# Patient Record
Sex: Male | Born: 1960 | Race: White | Hispanic: No | Marital: Married | Smoking: Current every day smoker
Health system: Southern US, Community
[De-identification: ages and names within clinical notes are randomized; demographics above are authoritative.]

## PROBLEM LIST (undated history)

## (undated) DIAGNOSIS — C679 Malignant neoplasm of bladder, unspecified: Secondary | ICD-10-CM

## (undated) DIAGNOSIS — B029 Zoster without complications: Secondary | ICD-10-CM

---

## 2014-10-06 ENCOUNTER — Emergency Department: Payer: Self-pay | Admitting: Emergency Medicine

## 2014-10-06 LAB — CBC WITH DIFFERENTIAL/PLATELET
BASOS PCT: 1.4 %
Basophil #: 0.1 10*3/uL (ref 0.0–0.1)
Eosinophil #: 0 10*3/uL (ref 0.0–0.7)
Eosinophil %: 0.3 %
HCT: 47.5 % (ref 40.0–52.0)
HGB: 15.4 g/dL (ref 13.0–18.0)
LYMPHS ABS: 1.4 10*3/uL (ref 1.0–3.6)
Lymphocyte %: 15.2 %
MCH: 31.7 pg (ref 26.0–34.0)
MCHC: 32.5 g/dL (ref 32.0–36.0)
MCV: 98 fL (ref 80–100)
Monocyte #: 0.8 x10 3/mm (ref 0.2–1.0)
Monocyte %: 9 %
NEUTROS PCT: 74.1 %
Neutrophil #: 6.8 10*3/uL — ABNORMAL HIGH (ref 1.4–6.5)
PLATELETS: 197 10*3/uL (ref 150–440)
RBC: 4.87 10*6/uL (ref 4.40–5.90)
RDW: 13.6 % (ref 11.5–14.5)
WBC: 9.2 10*3/uL (ref 3.8–10.6)

## 2014-10-06 LAB — BASIC METABOLIC PANEL
Anion Gap: 5 — ABNORMAL LOW (ref 7–16)
BUN: 15 mg/dL (ref 7–18)
CALCIUM: 9 mg/dL (ref 8.5–10.1)
CHLORIDE: 106 mmol/L (ref 98–107)
Co2: 25 mmol/L (ref 21–32)
Creatinine: 0.82 mg/dL (ref 0.60–1.30)
EGFR (African American): >60
EGFR (Non-African Amer.): >60
GLUCOSE: 92 mg/dL (ref 65–99)
Osmolality: 272 (ref 275–301)
Potassium: 4.3 mmol/L (ref 3.5–5.1)
Sodium: 136 mmol/L (ref 136–145)

## 2016-11-08 ENCOUNTER — Emergency Department
Admission: EM | Admit: 2016-11-08 | Discharge: 2016-11-08 | Disposition: A | Discharge status: Home / Self Care | Payer: Self-pay | Attending: Emergency Medicine | Admitting: Emergency Medicine

## 2016-11-08 ENCOUNTER — Encounter: Payer: Self-pay | Admitting: Emergency Medicine

## 2016-11-08 DIAGNOSIS — F17210 Nicotine dependence, cigarettes, uncomplicated: Secondary | ICD-10-CM | POA: Insufficient documentation

## 2016-11-08 DIAGNOSIS — Z8551 Personal history of malignant neoplasm of bladder: Secondary | ICD-10-CM | POA: Insufficient documentation

## 2016-11-08 DIAGNOSIS — K529 Noninfective gastroenteritis and colitis, unspecified: Secondary | ICD-10-CM | POA: Insufficient documentation

## 2016-11-08 HISTORY — DX: Malignant neoplasm of bladder, unspecified: C67.9

## 2016-11-08 LAB — URINALYSIS COMPLETE WITH MICROSCOPIC (ARMC ONLY)
BILIRUBIN URINE: NEGATIVE
Bacteria, UA: NONE SEEN
Glucose, UA: NEGATIVE mg/dL
Hgb urine dipstick: NEGATIVE
KETONES UR: NEGATIVE mg/dL
LEUKOCYTES UA: NEGATIVE
Nitrite: NEGATIVE
Protein, ur: NEGATIVE mg/dL
RBC / HPF: NONE SEEN RBC/hpf (ref 0–5)
Specific Gravity, Urine: 1.003 — ABNORMAL LOW (ref 1.005–1.030)
pH: 8 (ref 5.0–8.0)

## 2016-11-08 LAB — CBC WITH DIFFERENTIAL/PLATELET
BASOS ABS: 0 10*3/uL (ref 0–0.1)
Basophils Relative: 1 %
Eosinophils Absolute: 0 10*3/uL (ref 0–0.7)
Eosinophils Relative: 1 %
HEMATOCRIT: 47.9 % (ref 40.0–52.0)
Hemoglobin: 16.4 g/dL (ref 13.0–18.0)
LYMPHS ABS: 1.5 10*3/uL (ref 1.0–3.6)
LYMPHS PCT: 26 %
MCH: 32.9 pg (ref 26.0–34.0)
MCHC: 34.3 g/dL (ref 32.0–36.0)
MCV: 96 fL (ref 80.0–100.0)
MONO ABS: 0.6 10*3/uL (ref 0.2–1.0)
MONOS PCT: 11 %
NEUTROS ABS: 3.6 10*3/uL (ref 1.4–6.5)
Neutrophils Relative %: 61 %
Platelets: 199 10*3/uL (ref 150–440)
RBC: 4.99 MIL/uL (ref 4.40–5.90)
RDW: 13.4 % (ref 11.5–14.5)
WBC: 5.7 10*3/uL (ref 3.8–10.6)

## 2016-11-08 LAB — COMPREHENSIVE METABOLIC PANEL
ALT: 14 U/L — ABNORMAL LOW (ref 17–63)
AST: 22 U/L (ref 15–41)
Albumin: 4.3 g/dL (ref 3.5–5.0)
Alkaline Phosphatase: 49 U/L (ref 38–126)
Anion gap: 7 (ref 5–15)
BILIRUBIN TOTAL: 0.9 mg/dL (ref 0.3–1.2)
BUN: 17 mg/dL (ref 6–20)
CO2: 25 mmol/L (ref 22–32)
CREATININE: 0.85 mg/dL (ref 0.61–1.24)
Calcium: 9.5 mg/dL (ref 8.9–10.3)
Chloride: 106 mmol/L (ref 101–111)
GFR calc Af Amer: >60 mL/min (ref >60)
Glucose, Bld: 119 mg/dL — ABNORMAL HIGH (ref 65–99)
POTASSIUM: 4 mmol/L (ref 3.5–5.1)
Sodium: 138 mmol/L (ref 135–145)
TOTAL PROTEIN: 7.7 g/dL (ref 6.5–8.1)

## 2016-11-08 LAB — LIPASE, BLOOD: LIPASE: 24 U/L (ref 11–51)

## 2016-11-08 MED ORDER — SODIUM CHLORIDE 0.9 % IV SOLN
1000.0000 mL | Freq: Once | INTRAVENOUS | Status: AC
  Administered 2016-11-08: 1000 mL via INTRAVENOUS

## 2016-11-08 MED ORDER — ONDANSETRON 4 MG PO TBDP: 4.0000 mg | ORAL_TABLET | Freq: Three times a day (TID) | ORAL | 0 refills | Status: DC | PRN

## 2016-11-08 MED ORDER — ONDANSETRON HCL 4 MG/2ML IJ SOLN
4.0000 mg | Freq: Once | INTRAMUSCULAR | Status: AC
  Administered 2016-11-08: 4 mg via INTRAVENOUS
  Filled 2016-11-08: qty 2

## 2016-11-08 MED ORDER — MORPHINE SULFATE (PF) 4 MG/ML IV SOLN
4.0000 mg | Freq: Once | INTRAVENOUS | Status: DC
  Filled 2016-11-08: qty 1

## 2016-11-08 NOTE — ED Notes (Signed)
Pt given crackers and ginger ale for PO trial; pt reports no difficulty or nausea since consuming.

## 2016-11-08 NOTE — ED Provider Notes (Signed)
[facility] Emergency Department Provider Note        Time seen: ----------------------------------------- 7:01 AM on 11/08/2016 -----------------------------------------    I have reviewed the triage vital signs and the nursing notes.   HISTORY  Chief Complaint Emesis and Diarrhea    HPI [facility] is a 55 y.o. male who presents to the ER for nausea, vomiting and diarrhea.Patient reports and Sundays having headache, chills, body aches with vomiting and diarrhea. Nothing makes his symptoms better. Family is concerned that may have had food poisoning. He complains of generalized abdominal cramping, states he has been to keep some liquids down but no solid food.   Past Medical History:  Diagnosis Date  . Bladder cancer ([place])     There are no active problems to display for this patient.   History reviewed. No pertinent surgical history.  Allergies Patient has no known allergies.  Social History Social History  Substance Use Topics  . Smoking status: Current Every Day Smoker    Packs/day: 1.00    Types: Cigarettes  . Smokeless tobacco: Never Used  . Alcohol use Yes     Comment: daily    Review of Systems Constitutional: Negative for fever.Positive for chills Cardiovascular: Negative for chest pain. Respiratory: Negative for shortness of breath. Gastrointestinal: Positive for abdominal pain, vomiting and diarrhea Genitourinary: Negative for dysuria. Musculoskeletal: Negative for back pain. Skin: Negative for rash. Neurological: Positive for headache  10-point ROS otherwise negative.  ____________________________________________   PHYSICAL EXAM:  VITAL SIGNS: ED Triage Vitals  Enc Vitals Group     BP 11/08/16 0640 (!) 149/91     Pulse Rate 11/08/16 0640 80     Resp 11/08/16 0640 18     Temp 11/08/16 0640 98.1 F (36.7 C)     Temp Source 11/08/16 0640 Oral     SpO2 11/08/16 0640 99 %     Weight 11/08/16 0638 150 lb (68 kg)      Height 11/08/16 0638 5\' 10"  (1.778 m)     Head Circumference --      Peak Flow --      Pain Score 11/08/16 0638 4     Pain Loc --      Pain Edu? --      Excl. in [place]? --     Constitutional: Alert and oriented. Well appearing and in no distress. Eyes: Conjunctivae are normal. PERRL. Normal extraocular movements. ENT   Head: Normocephalic and atraumatic.   Nose: No congestion/rhinnorhea.   Mouth/Throat: Mucous membranes are moist.   Neck: No stridor. Cardiovascular: Normal rate, regular rhythm. No murmurs, rubs, or gallops. Respiratory: Normal respiratory effort without tachypnea nor retractions. Breath sounds are clear and equal bilaterally. No wheezes/rales/rhonchi. Gastrointestinal: Soft and nontender. Normal bowel sounds Musculoskeletal: Nontender with normal range of motion in all extremities. No lower extremity tenderness nor edema. Neurologic:  Normal speech and language. No gross focal neurologic deficits are appreciated.  Skin:  Skin is warm, dry and intact. No rash noted. Psychiatric: Mood and affect are normal. Speech and behavior are normal.  ____________________________________________  ED COURSE:  Pertinent labs & imaging results that were available during my care of the patient were reviewed by me and considered in my medical decision making (see chart for details). Clinical Course   Patient is in no distress, we will assess with basic labs and give IV fluid with antiemetics  Procedures ____________________________________________   LABS (pertinent positives/negatives)  Labs Reviewed  COMPREHENSIVE METABOLIC PANEL - Abnormal; Notable  for the following:       Result Value   Glucose, Bld 119 (*)    ALT 14 (*)    All other components within normal limits  URINALYSIS COMPLETEWITH MICROSCOPIC ([facility] ONLY) - Abnormal; Notable for the following:    Color, Urine STRAW (*)    APPearance CLEAR (*)    Specific Gravity, Urine 1.003 (*)    Squamous  Epithelial / LPF 0-5 (*)    All other components within normal limits  CBC WITH DIFFERENTIAL/PLATELET  LIPASE, BLOOD   ____________________________________________  FINAL ASSESSMENT AND PLAN  Gastroenteritis  Plan: Patient with labs as dictated above. Patient looks well, is in no acute distress and has tolerated food by mouth. He'll be discharged antiemetics and encouraged close follow-up with his doctor.   Earleen Newport, MD   Note: This dictation was prepared with Dragon dictation. Any transcriptional errors that result from this process are unintentional    Earleen Newport, MD 11/08/16 [phone]

## 2016-11-08 NOTE — ED Notes (Signed)
Pt discharged home after verbalizing understanding of discharge instructions; nad noted. 

## 2016-11-08 NOTE — ED Notes (Signed)
Pt presents with n/v/d x 3 days. Wife states he has been "drinking like a fish" for a while due to depression. States he is unable to work due to "sickness" and is going to file bankruptcy, which has led to depression.

## 2016-11-08 NOTE — ED Triage Notes (Signed)
Patient ambulatory to triage with steady gait, without difficulty or distress noted; pt reports since Sunday having HA, chills, body aches, N/V/D

## 2018-04-27 ENCOUNTER — Emergency Department
Admission: EM | Admit: 2018-04-27 | Discharge: 2018-04-27 | Disposition: A | Discharge status: Home / Self Care | Payer: Self-pay | Attending: Emergency Medicine | Admitting: Emergency Medicine

## 2018-04-27 ENCOUNTER — Other Ambulatory Visit: Payer: Self-pay

## 2018-04-27 DIAGNOSIS — B029 Zoster without complications: Secondary | ICD-10-CM | POA: Insufficient documentation

## 2018-04-27 DIAGNOSIS — Z8551 Personal history of malignant neoplasm of bladder: Secondary | ICD-10-CM | POA: Insufficient documentation

## 2018-04-27 DIAGNOSIS — F17210 Nicotine dependence, cigarettes, uncomplicated: Secondary | ICD-10-CM | POA: Insufficient documentation

## 2018-04-27 HISTORY — DX: Zoster without complications: B02.9

## 2018-04-27 MED ORDER — VALACYCLOVIR HCL 1 G PO TABS: 1000.0000 mg | ORAL_TABLET | Freq: Three times a day (TID) | ORAL | 0 refills | Status: DC

## 2018-04-27 MED ORDER — GABAPENTIN 100 MG PO CAPS: 100.0000 mg | ORAL_CAPSULE | Freq: Three times a day (TID) | ORAL | 0 refills | Status: DC

## 2018-04-27 NOTE — ED Provider Notes (Signed)
[facility] Emergency Department Provider Note ____________________________________________  Time seen:1225  I have reviewed the triage vital signs and the nursing notes.  HISTORY  Chief Complaint  Rash   HPI [facility] is a 57 y.o. male who presents to the ER today with complaint of a rash on his right flank.  He reports it started this morning.  The rash has spread around to the front side of his abdomen.  The rash burns.  He has never had a rash like this before.  He was bitten by tick approximately 1 week ago, but reports the tick was small and not engorged.  He also does not think the tick was on there for more than 24 hours.  He has not tried anything over-the-counter prior to coming to the ER today.  No one in his home has a similar rash.  Past Medical History:  Diagnosis Date  . Bladder cancer ([place])     There are no active problems to display for this patient.   History reviewed. No pertinent surgical history.  Prior to Admission medications   Medication Sig Start Date End Date Taking? Authorizing Provider  gabapentin (NEURONTIN) 100 MG capsule Take 1 capsule (100 mg total) by mouth 3 (three) times daily. 04/27/18   Jearld Fenton, NP  ondansetron (ZOFRAN ODT) 4 MG disintegrating tablet Take 1 tablet (4 mg total) by mouth every 8 (eight) hours as needed for nausea or vomiting. 11/08/16   Earleen Newport, MD  valACYclovir (VALTREX) 1000 MG tablet Take 1 tablet (1,000 mg total) by mouth 3 (three) times daily. 04/27/18   Jearld Fenton, NP    Allergies Patient has no known allergies.  History reviewed. No pertinent family history.  Social History Social History   Tobacco Use  . Smoking status: Current Every Day Smoker    Packs/day: 1.00    Types: Cigarettes  . Smokeless tobacco: Never Used  Substance Use Topics  . Alcohol use: Yes    Comment: daily  . Drug use: Not on file    Review of Systems  Constitutional: Negative for  fever. Cardiovascular: Negative for chest pain. Respiratory: Negative for shortness of breath. Skin: Positive for rash. Neurological: Negative for headaches, focal weakness or numbness. ____________________________________________  PHYSICAL EXAM:  VITAL SIGNS: ED Triage Vitals  Enc Vitals Group     BP 04/27/18 1145 126/79     Pulse Rate 04/27/18 1145 77     Resp 04/27/18 1145 18     Temp 04/27/18 1145 98 F (36.7 C)     Temp Source 04/27/18 1145 Oral     SpO2 04/27/18 1145 98 %     Weight 04/27/18 1144 150 lb (68 kg)     Height 04/27/18 1144 5\' 11"  (1.803 m)     Head Circumference --      Peak Flow --      Pain Score 04/27/18 1144 7     Pain Loc --      Pain Edu? --      Excl. in [place]? --     Constitutional: Alert and oriented. Well appearing and in no distress. Cardiovascular: Normal rate, regular rhythm.  Respiratory: Normal respiratory effort. No wheezes/rales/rhonchi. Neurologic:  Normal speech and language.  Skin: Vesicular rash on erythematous base noted in a dermatome pattern starting at midline back extending around the right flank to the midline abdomen.  No open lesions noted   INITIAL IMPRESSION / ASSESSMENT AND PLAN / ED COURSE  Rash:  Consistent with herpes zoster Rx provided for Valtrex 1 g 3 times a day for 7 days Rx provided for Neurontin 100 mg 3 times a day as needed for 7 to 10 days Advised him to return to urgent care or ER if symptoms persist or worsen  ____________________________________________  FINAL CLINICAL IMPRESSION(S) / ED DIAGNOSES  Final diagnoses:  Herpes zoster without complication      Jearld Fenton, NP 04/27/18 [place]    Lavonia Drafts, MD 04/27/18 1313

## 2018-04-27 NOTE — Discharge Instructions (Signed)
You have been diagnosed with shingles. We have given you a RX for Valtrex 3 x day for 7 days and Neurontin 3 x day for 7-10 days. Return to the ER if symptoms persist or worsen

## 2018-04-27 NOTE — ED Triage Notes (Signed)
Pt arrived via POV d/t rash that he noticed today on the right side of his abdomen. Pt reports being bitten by a tick a week ago in the same area. Pt has multiple groups of rashes under the right rib cage and pain with anything rubbing against that area of the skin. Pt c/o of a burning pain. Pt is A&O x4 at this time.

## 2018-04-30 ENCOUNTER — Emergency Department
Admission: EM | Admit: 2018-04-30 | Discharge: 2018-04-30 | Disposition: A | Discharge status: Home / Self Care | Payer: Self-pay | Attending: Emergency Medicine | Admitting: Emergency Medicine

## 2018-04-30 ENCOUNTER — Other Ambulatory Visit: Payer: Self-pay

## 2018-04-30 DIAGNOSIS — F17210 Nicotine dependence, cigarettes, uncomplicated: Secondary | ICD-10-CM | POA: Insufficient documentation

## 2018-04-30 DIAGNOSIS — B029 Zoster without complications: Secondary | ICD-10-CM | POA: Insufficient documentation

## 2018-04-30 HISTORY — DX: Zoster without complications: B02.9

## 2018-04-30 MED ORDER — OXYCODONE-ACETAMINOPHEN 5-325 MG PO TABS
1.0000 | ORAL_TABLET | Freq: Once | ORAL | Status: AC
  Administered 2018-04-30: 1 via ORAL
  Filled 2018-04-30: qty 1

## 2018-04-30 MED ORDER — OXYCODONE-ACETAMINOPHEN 5-325 MG PO TABS: 1.0000 | ORAL_TABLET | ORAL | 0 refills | Status: DC | PRN

## 2018-04-30 NOTE — ED Provider Notes (Signed)
[facility] Emergency Department Provider Note  ____________________________________________   First MD Initiated Contact with Patient 04/30/18 1420     (approximate)  I have reviewed the triage vital signs and the nursing notes.   HISTORY  Chief Complaint Varicella   HPI [facility] is a 57 y.o. male with a remote history of bladder cancer as well as shingles was presented to the emergency department today with a right-sided rash that is very painful.  He says that he was diagnosed with shingles 3 days ago and has been taking Valtrex as well as gabapentin.  However, he has a burning pain to the right flank and abdomen in the distribution of the rash.  Does not report fever.  As far as he knows he is in remission from his cancer and has no active disease.  Denies any HIV or any other medications that are immunosuppressants that he takes.  Says that he just takes ibuprofen/Aleve intermittently.  He says that the rash seems to be more red but is still in the same distribution and is not spread tending the part of the body.   Past Medical History:  Diagnosis Date  . Bladder cancer ([place])   . Shingles 04/27/2018    There are no active problems to display for this patient.   History reviewed. No pertinent surgical history.  Prior to Admission medications   Medication Sig Start Date End Date Taking? Authorizing Provider  gabapentin (NEURONTIN) 100 MG capsule Take 1 capsule (100 mg total) by mouth 3 (three) times daily. 04/27/18  Yes Baity, Coralie Keens, NP  valACYclovir (VALTREX) 1000 MG tablet Take 1 tablet (1,000 mg total) by mouth 3 (three) times daily. 04/27/18  Yes Baity, Coralie Keens, NP  ondansetron (ZOFRAN ODT) 4 MG disintegrating tablet Take 1 tablet (4 mg total) by mouth every 8 (eight) hours as needed for nausea or vomiting. 11/08/16   Earleen Newport, MD    Allergies Patient has no known allergies.  History reviewed. No pertinent family  history.  Social History Social History   Tobacco Use  . Smoking status: Current Every Day Smoker    Packs/day: 1.00    Types: Cigarettes  . Smokeless tobacco: Never Used  Substance Use Topics  . Alcohol use: Yes    Comment: daily  . Drug use: Not on file    Review of Systems  Constitutional: No fever/chills Eyes: No visual changes. ENT: No sore throat. Cardiovascular: Denies chest pain. Respiratory: Denies shortness of breath. Gastrointestinal: No abdominal pain.  No nausea, no vomiting.  No diarrhea.  No constipation. Genitourinary: Negative for dysuria. Musculoskeletal: Negative for back pain. Skin: As above Neurological: Negative for headaches, focal weakness or numbness.   ____________________________________________   PHYSICAL EXAM:  VITAL SIGNS: ED Triage Vitals [04/30/18 1124]  Enc Vitals Group     BP (!) 115/97     Pulse Rate 81     Resp 18     Temp 98.1 F (36.7 C)     Temp Source Oral     SpO2 99 %     Weight 150 lb (68 kg)     Height 5\' 11"  (1.803 m)     Head Circumference      Peak Flow      Pain Score 10     Pain Loc      Pain Edu?      Excl. in [place]?     Constitutional: Alert and oriented. Well appearing and in no  acute distress. Eyes: Conjunctivae are normal.  Head: Atraumatic. Nose: No congestion/rhinnorhea. Mouth/Throat: Mucous membranes are moist.  Neck: No stridor.   Cardiovascular: Normal rate, regular rhythm. Grossly normal heart sounds.   Respiratory: Normal respiratory effort.  No retractions. Lungs CTAB. Gastrointestinal: Soft and nontender. No distention.  Musculoskeletal: No lower extremity tenderness nor edema.  No joint effusions. Neurologic:  Normal speech and language. No gross focal neurologic deficits are appreciated. Skin: Erythematous rash with scattered vesicles from the midline of the mid lumbar spine around to the abdomen.  The rash does not cross the midline.  It is tender to palpation.  There is no exudates or  induration associated with this rash. Psychiatric: Mood and affect are normal. Speech and behavior are normal.  ____________________________________________   LABS (all labs ordered are listed, but only abnormal results are displayed)  Labs Reviewed - No data to display ____________________________________________  EKG   ____________________________________________  RADIOLOGY   ____________________________________________   PROCEDURES  Procedure(s) performed:   Procedures  Critical Care performed:   ____________________________________________   INITIAL IMPRESSION / ASSESSMENT AND PLAN / ED COURSE  Pertinent labs & imaging results that were available during my care of the patient were reviewed by me and considered in my medical decision making (see chart for details).  DDX: Herpes zoster, neuralgia, cellulitis As part of my medical decision making, I reviewed the following data within the electronic MEDICAL RECORD NUMBER Notes from prior ED visits  The rash does not cross the midline.  Appears to be isolated to only one dermatome.  Patient has been taking gabapentin which is likely inadequate for his pain control.  I will not be starting glucocorticoids the evidence to date has shown that this is not superior to placebo and may cause infection.  Furthermore, the patient is greater than 72 hours out from his diagnosis of the rash.  I will be discharging him with Percocet as I believe the issue at this point is pain control.  I will also give him a work note for work. ____________________________________________   FINAL CLINICAL IMPRESSION(S) / ED DIAGNOSES  Pain from zoster.    NEW MEDICATIONS STARTED DURING THIS VISIT:  New Prescriptions   No medications on file     Note:  This document was prepared using Dragon voice recognition software and may include unintentional dictation errors.     Orbie Pyo, MD 04/30/18 [phone]

## 2018-04-30 NOTE — ED Notes (Signed)
Per Dr. Joni Fears no blood work at this time.

## 2018-04-30 NOTE — ED Notes (Signed)
heppa filter on in room.  Contact and airborne isolation sign on ddoor.  Pt and family informed of isolation procedure.

## 2018-04-30 NOTE — ED Notes (Addendum)
Says he is taking gabapentin for shingles on right side chest/abd/back.  Says this is not helpiing his pain.  Has raised rash on side of chest with no drainage.

## 2018-04-30 NOTE — ED Notes (Signed)
AAOx3.  Skin warm and dry.  NAD 

## 2018-04-30 NOTE — ED Triage Notes (Addendum)
Pt comes in c/o of pain due to shingles that he has. Pain 10/10 in abdominal and spine region. Pt just started gabapentin 100 mg and valacyclovir 1 gram last week that he states is not helping.

## 2018-06-03 ENCOUNTER — Emergency Department: Payer: Self-pay

## 2018-06-03 ENCOUNTER — Other Ambulatory Visit: Payer: Self-pay

## 2018-06-03 ENCOUNTER — Emergency Department
Admission: EM | Admit: 2018-06-03 | Discharge: 2018-06-03 | Disposition: A | Discharge status: Home / Self Care | Payer: Self-pay | Attending: Emergency Medicine | Admitting: Emergency Medicine

## 2018-06-03 ENCOUNTER — Encounter: Payer: Self-pay | Admitting: Physician Assistant

## 2018-06-03 DIAGNOSIS — J410 Simple chronic bronchitis: Secondary | ICD-10-CM | POA: Insufficient documentation

## 2018-06-03 DIAGNOSIS — Z8551 Personal history of malignant neoplasm of bladder: Secondary | ICD-10-CM | POA: Insufficient documentation

## 2018-06-03 DIAGNOSIS — F17210 Nicotine dependence, cigarettes, uncomplicated: Secondary | ICD-10-CM | POA: Insufficient documentation

## 2018-06-03 DIAGNOSIS — B0229 Other postherpetic nervous system involvement: Secondary | ICD-10-CM | POA: Insufficient documentation

## 2018-06-03 DIAGNOSIS — R0689 Other abnormalities of breathing: Secondary | ICD-10-CM

## 2018-06-03 DIAGNOSIS — R52 Pain, unspecified: Secondary | ICD-10-CM

## 2018-06-03 MED ORDER — OXYCODONE-ACETAMINOPHEN 5-325 MG PO TABS
1.0000 | ORAL_TABLET | Freq: Once | ORAL | Status: AC
Start: 2018-06-03 — End: 2018-06-03
  Administered 2018-06-03: 1 via ORAL
  Filled 2018-06-03: qty 1

## 2018-06-03 MED ORDER — IPRATROPIUM-ALBUTEROL 0.5-2.5 (3) MG/3ML IN SOLN
3.0000 mL | Freq: Once | RESPIRATORY_TRACT | Status: AC
  Administered 2018-06-03: 3 mL via RESPIRATORY_TRACT
  Filled 2018-06-03: qty 3

## 2018-06-03 MED ORDER — OXYCODONE-ACETAMINOPHEN 5-325 MG PO TABS: 1.0000 | ORAL_TABLET | ORAL | 0 refills | Status: DC | PRN

## 2018-06-03 MED ORDER — GABAPENTIN 100 MG PO CAPS: 100.0000 mg | ORAL_CAPSULE | Freq: Three times a day (TID) | ORAL | 3 refills | Status: DC

## 2018-06-03 MED ORDER — PREDNISONE 10 MG PO TABS: ORAL_TABLET | ORAL | 0 refills | Status: DC

## 2018-06-03 NOTE — Discharge Instructions (Addendum)
Follow-up with your regular doctor or [facility] if you continue to have problems.  Take the medication as prescribed.  You may also take Aleve or ibuprofen.  The gabapentin is a long-term medication to help decrease inflammation on the nerve endings.  I would recommend that you take this with the other medications we have prescribed.  And continue to take this medication for at least 1 month.

## 2018-06-03 NOTE — ED Provider Notes (Signed)
[facility] Emergency Department Provider Note  ____________________________________________   First MD Initiated Contact with Patient 06/03/18 [phone]     (approximate)  I have reviewed the triage vital signs and the nursing notes.   HISTORY  Chief Complaint Shingles related pain    HPI [facility] is a 57 y.o. male presents emergency department complaining of continued right side pain.  He was diagnosed with shingles and states that the rash is still painful.  He states he was only given 2 days of Percocet and is been having increased pain since then.  He states that hurts more than when it first started.  His family member states they are concerned as he has been acting confused and has not been able to sleep due to the pain.  He denies any injury.  He does have a history of bladder cancer.  He denies any fever or chills.  Past Medical History:  Diagnosis Date  . Bladder cancer ([place])   . Shingles 04/27/2018    There are no active problems to display for this patient.   History reviewed. No pertinent surgical history.  Prior to Admission medications   Medication Sig Start Date End Date Taking? Authorizing Provider  gabapentin (NEURONTIN) 100 MG capsule Take 1 capsule (100 mg total) by mouth 3 (three) times daily. 06/03/18   [place], Linden Dolin, PA-C  oxyCODONE-acetaminophen (PERCOCET/ROXICET) 5-325 MG tablet Take 1 tablet by mouth every 4 (four) hours as needed for severe pain. 06/03/18   Akshath Mccarey, Linden Dolin, PA-C  predniSONE (DELTASONE) 10 MG tablet Take 6 pills on day 1 and decrease by 1 pill each day until all are gone.  Take these early in the morning. 06/03/18   Versie Starks, PA-C    Allergies Patient has no known allergies.  No family history on file.  Social History Social History   Tobacco Use  . Smoking status: Current Every Day Smoker    Packs/day: 1.00    Types: Cigarettes  . Smokeless tobacco: Never Used  Substance Use Topics  .  Alcohol use: Yes    Comment: daily  . Drug use: Not on file    Review of Systems  Constitutional: No fever/chills Eyes: No visual changes. ENT: No sore throat. Respiratory: Denies cough, positive for right rib pain Genitourinary: Negative for dysuria. Musculoskeletal: Negative for back pain. Skin: Positive for rash.    ____________________________________________   PHYSICAL EXAM:  VITAL SIGNS: ED Triage Vitals  Enc Vitals Group     BP 06/03/18 0650 (!) 143/98     Pulse Rate 06/03/18 0650 82     Resp 06/03/18 0650 18     Temp 06/03/18 0650 97.7 F (36.5 C)     Temp Source 06/03/18 0650 Oral     SpO2 06/03/18 0650 97 %     Weight 06/03/18 0649 150 lb (68 kg)     Height 06/03/18 0649 5\' 11"  (1.803 m)     Head Circumference --      Peak Flow --      Pain Score --      Pain Loc --      Pain Edu? --      Excl. in [place]? --     Constitutional: Alert and oriented. Well appearing and in mild distress. Eyes: Conjunctivae are normal.  Head: Atraumatic. Nose: No congestion/rhinnorhea. Mouth/Throat: Mucous membranes are moist. Neck: Is supple, no lymphadenopathy is noted Cardiovascular: Normal rate, regular rhythm.  Heart sounds are normal Respiratory: Normal  respiratory effort.  No retractions, decreased lung sounds bilaterally.  The rib area on the right is tender with a healing shingles type rash. GU: deferred Musculoskeletal: FROM all extremities, warm and well perfused Neurologic:  Normal speech and language.  Skin:  Skin is warm, dry and intact. No rash noted. Psychiatric: Mood and affect are normal. Speech and behavior are normal.  ____________________________________________   LABS (all labs ordered are listed, but only abnormal results are displayed)  Labs Reviewed - No data to display ____________________________________________   ____________________________________________  RADIOLOGY  Chest x-ray is negative for any acute abnormality.  The patient does  have COPD per the x-ray.  ____________________________________________   PROCEDURES  Procedure(s) performed: DuoNeb, Percocet 5/325  Procedures    ____________________________________________   INITIAL IMPRESSION / ASSESSMENT AND PLAN / ED COURSE  Pertinent labs & imaging results that were available during my care of the patient were reviewed by me and considered in my medical decision making (see chart for details).  Patient is 57 year old male presents emergency department complaining of continued right side pain which is increasing.  He states that he was diagnosed with shingles and given 2 days of Percocet which helped with the pain at that time.  He states that the pain is been increasing since then.  His family members concerned due to the recent confusion and the inability for him to sleep due to the pain.  On physical exam there is a diminishing rash in the dermatome along the ribs.  It does extend around to the abdomen but is only on one side.  The lungs have decreased lung sounds bilaterally.  Heart sounds are normal.  DuoNeb, chest x-ray, and Percocet were ordered.   Chest x-ray is negative for any acute abnormality.  The patient did have increased air movement with the DuoNeb.  He has been given the Percocet and he states he is having some relief with this medication.  Explained the chest x-ray findings to the patient.  Explained to him this is a postherpetic neuralgia.  He was given a prescription for prednisone Dosepak, gabapentin, and Percocet 5/325 #20 with no refill.  Explained to the patient we will be able to continue to give him narcotics from the emergency department.  It is important that he find a regular doctor.  All questions were answered to the best of my ability.  Patient was discharged in stable condition.  As part of my medical decision making, I reviewed the following data within the [place] History obtained from family, Nursing notes  reviewed and incorporated, Old chart reviewed, Radiograph reviewed chest x-ray is negative, Notes from prior ED visits and [place] Controlled Substance Database  ____________________________________________   FINAL CLINICAL IMPRESSION(S) / ED DIAGNOSES  Final diagnoses:  Post herpetic neuralgia  Simple chronic bronchitis ([place])      NEW MEDICATIONS STARTED DURING THIS VISIT:  Discharge Medication List as of 06/03/2018  8:21 AM    START taking these medications   Details  predniSONE (DELTASONE) 10 MG tablet Take 6 pills on day 1 and decrease by 1 pill each day until all are gone.  Take these early in the morning., Print         Note:  This document was prepared using Dragon voice recognition software and may include unintentional dictation errors.    Versie Starks, PA-C 06/03/18 [place], [place], MD 06/04/18 [phone]

## 2018-06-03 NOTE — ED Triage Notes (Addendum)
Patient reports diagnosed with shingles at beginning of May, no longer has open rash.  Reports continued pain on right upper abdomen around to his back.

## 2018-06-03 NOTE — ED Notes (Signed)
See triage note.  States he was dx'd with shingles the first on May   Was given meds for same  Stats the rash is better but pain is increased

## 2018-06-19 ENCOUNTER — Encounter: Payer: Self-pay | Admitting: Emergency Medicine

## 2018-06-19 ENCOUNTER — Other Ambulatory Visit: Payer: Self-pay

## 2018-06-19 ENCOUNTER — Emergency Department
Admission: EM | Admit: 2018-06-19 | Discharge: 2018-06-19 | Disposition: A | Discharge status: Home / Self Care | Payer: Self-pay | Attending: Student in an Organized Health Care Education/Training Program | Admitting: Student in an Organized Health Care Education/Training Program

## 2018-06-19 ENCOUNTER — Emergency Department: Payer: Self-pay

## 2018-06-19 DIAGNOSIS — E860 Dehydration: Secondary | ICD-10-CM | POA: Insufficient documentation

## 2018-06-19 DIAGNOSIS — Z8551 Personal history of malignant neoplasm of bladder: Secondary | ICD-10-CM | POA: Insufficient documentation

## 2018-06-19 DIAGNOSIS — Z79899 Other long term (current) drug therapy: Secondary | ICD-10-CM | POA: Insufficient documentation

## 2018-06-19 DIAGNOSIS — R42 Dizziness and giddiness: Secondary | ICD-10-CM

## 2018-06-19 DIAGNOSIS — F17210 Nicotine dependence, cigarettes, uncomplicated: Secondary | ICD-10-CM | POA: Insufficient documentation

## 2018-06-19 LAB — TROPONIN I

## 2018-06-19 LAB — CBC WITH DIFFERENTIAL/PLATELET
Basophils Absolute: 0 10*3/uL (ref 0–0.1)
Basophils Relative: 0 %
EOS ABS: 0 10*3/uL (ref 0–0.7)
EOS PCT: 0 %
HCT: 42.3 % (ref 40.0–52.0)
Hemoglobin: 14.8 g/dL (ref 13.0–18.0)
LYMPHS ABS: 1 10*3/uL (ref 1.0–3.6)
Lymphocytes Relative: 10 %
MCH: 34.1 pg — AB (ref 26.0–34.0)
MCHC: 35 g/dL (ref 32.0–36.0)
MCV: 97.3 fL (ref 80.0–100.0)
MONOS PCT: 7 %
Monocytes Absolute: 0.7 10*3/uL (ref 0.2–1.0)
Neutro Abs: 7.9 10*3/uL — ABNORMAL HIGH (ref 1.4–6.5)
Neutrophils Relative %: 83 %
PLATELETS: 214 10*3/uL (ref 150–440)
RBC: 4.34 MIL/uL — ABNORMAL LOW (ref 4.40–5.90)
RDW: 13.4 % (ref 11.5–14.5)
WBC: 9.6 10*3/uL (ref 3.8–10.6)

## 2018-06-19 LAB — BASIC METABOLIC PANEL
Anion gap: 7 (ref 5–15)
BUN: 23 mg/dL — AB (ref 6–20)
CALCIUM: 8.7 mg/dL — AB (ref 8.9–10.3)
CO2: 25 mmol/L (ref 22–32)
CREATININE: 0.65 mg/dL (ref 0.61–1.24)
Chloride: 106 mmol/L (ref 98–111)
GFR calc Af Amer: >60 mL/min (ref >60)
Glucose, Bld: 111 mg/dL — ABNORMAL HIGH (ref 70–99)
Potassium: 4.1 mmol/L (ref 3.5–5.1)
SODIUM: 138 mmol/L (ref 135–145)

## 2018-06-19 LAB — URINALYSIS, COMPLETE (UACMP) WITH MICROSCOPIC
BACTERIA UA: NONE SEEN
BILIRUBIN URINE: NEGATIVE
Glucose, UA: 50 mg/dL — AB
HGB URINE DIPSTICK: NEGATIVE
KETONES UR: NEGATIVE mg/dL
LEUKOCYTES UA: NEGATIVE
NITRITE: NEGATIVE
Protein, ur: NEGATIVE mg/dL
SPECIFIC GRAVITY, URINE: 1.011 (ref 1.005–1.030)
pH: 6 (ref 5.0–8.0)

## 2018-06-19 MED ORDER — SODIUM CHLORIDE 0.9 % IV BOLUS
1000.0000 mL | Freq: Once | INTRAVENOUS | Status: AC
  Administered 2018-06-19: 1000 mL via INTRAVENOUS

## 2018-06-19 MED ORDER — GABAPENTIN 400 MG PO CAPS
400.0000 mg | ORAL_CAPSULE | Freq: Once | ORAL | Status: AC
  Administered 2018-06-19: 400 mg via ORAL
  Filled 2018-06-19: qty 1

## 2018-06-19 MED ORDER — GABAPENTIN 100 MG PO CAPS: 100.0000 mg | ORAL_CAPSULE | Freq: Three times a day (TID) | ORAL | 3 refills | Status: AC

## 2018-06-19 NOTE — ED Provider Notes (Signed)
[facility] Emergency Department Provider Note    First MD Initiated Contact with Patient 06/19/18 [phone]     (approximate)  I have reviewed the triage vital signs and the nursing notes.   HISTORY  Chief Complaint Numbness and Dizziness    HPI Theodore Hill is a 57 y.o. male with a history of bladder cancer as well as recent diagnosis of shingles currently suffering from postherpetic neuralgia of the right side presents to the ER after having flush sensation as well as electric-like brief pain on the left triceps region.  States that lasted only seconds.  Denied any chest pain or shortness of breath.  States he did have brief tingling in his arm associated with some lightheadedness but this was only a few seconds in nature.  Went to talk to his boss at work and they called EMS.  Patient did feel somewhat lightheaded when standing.  Denies any palpitations.  No leg swelling.  Denies any history of heart disease.  Does not have a PCP.    Past Medical History:  Diagnosis Date  . Bladder cancer ([place])   . Shingles 04/27/2018   No family history on file. History reviewed. No pertinent surgical history. There are no active problems to display for this patient.     Prior to Admission medications   Medication Sig Start Date End Date Taking? Authorizing Provider  gabapentin (NEURONTIN) 100 MG capsule Take 1 capsule (100 mg total) by mouth 3 (three) times daily. 06/19/18   Merlyn Lot, MD  oxyCODONE-acetaminophen (PERCOCET/ROXICET) 5-325 MG tablet Take 1 tablet by mouth every 4 (four) hours as needed for severe pain. 06/03/18   Fisher, Linden Dolin, PA-C  predniSONE (DELTASONE) 10 MG tablet Take 6 pills on day 1 and decrease by 1 pill each day until all are gone.  Take these early in the morning. 06/03/18   Versie Starks, PA-C    Allergies Patient has no known allergies.    Social History Social History   Tobacco Use  . Smoking status: Current Every Day  Smoker    Packs/day: 1.00    Types: Cigarettes  . Smokeless tobacco: Never Used  Substance Use Topics  . Alcohol use: Yes    Comment: daily  . Drug use: Not on file    Review of Systems Patient denies headaches, rhinorrhea, blurry vision, numbness, shortness of breath, chest pain, edema, cough, abdominal pain, nausea, vomiting, diarrhea, dysuria, fevers, rashes or hallucinations unless otherwise stated above in HPI. ____________________________________________   PHYSICAL EXAM:  VITAL SIGNS: Vitals:   06/19/18 0907 06/19/18 1048  BP: (!) 133/92 116/78  Pulse: 78 64  Resp: 16 18  Temp: 98.1 F (36.7 C) 97.7 F (36.5 C)  SpO2: 100% 100%    Constitutional: Alert and oriented.  Eyes: Conjunctivae are normal.  Head: Atraumatic. Nose: No congestion/rhinnorhea. Mouth/Throat: Mucous membranes are moist.   Neck: No stridor. Painless ROM.  Cardiovascular: Normal rate, regular rhythm. Grossly normal heart sounds.  Good peripheral circulation. Respiratory: Normal respiratory effort.  No retractions. Lungs CTAB. Gastrointestinal: Soft with ttp of right abd related to previous and healing shingles rash. No distention. No abdominal bruits. No CVA tenderness. Genitourinary:  Musculoskeletal: No lower extremity tenderness nor edema.  No joint effusions. Neurologic:  Normal speech and language. No gross focal neurologic deficits are appreciated. No facial droop Skin:  Skin is warm, dry and intact. No rash noted. Psychiatric: Mood and affect are normal. Speech and behavior are normal.  ____________________________________________  LABS (all labs ordered are listed, but only abnormal results are displayed)  Results for orders placed or performed during the hospital encounter of 06/19/18 (from the past 24 hour(s))  Urinalysis, Complete w Microscopic     Status: Abnormal   Collection Time: 06/19/18  9:11 AM  Result Value Ref Range   Color, Urine YELLOW (A) YELLOW   APPearance CLEAR  (A) CLEAR   Specific Gravity, Urine 1.011 1.005 - 1.030   pH 6.0 5.0 - 8.0   Glucose, UA 50 (A) NEGATIVE mg/dL   Hgb urine dipstick NEGATIVE NEGATIVE   Bilirubin Urine NEGATIVE NEGATIVE   Ketones, ur NEGATIVE NEGATIVE mg/dL   Protein, ur NEGATIVE NEGATIVE mg/dL   Nitrite NEGATIVE NEGATIVE   Leukocytes, UA NEGATIVE NEGATIVE   RBC / HPF 0-5 0 - 5 RBC/hpf   WBC, UA 0-5 0 - 5 WBC/hpf   Bacteria, UA NONE SEEN NONE SEEN   Squamous Epithelial / LPF 0-5 0 - 5   Mucus PRESENT   CBC with Differential/Platelet     Status: Abnormal   Collection Time: 06/19/18  9:11 AM  Result Value Ref Range   WBC 9.6 3.8 - 10.6 K/uL   RBC 4.34 (L) 4.40 - 5.90 MIL/uL   Hemoglobin 14.8 13.0 - 18.0 g/dL   HCT 42.3 40.0 - 52.0 %   MCV 97.3 80.0 - 100.0 fL   MCH 34.1 (H) 26.0 - 34.0 pg   MCHC 35.0 32.0 - 36.0 g/dL   RDW 13.4 11.5 - 14.5 %   Platelets 214 150 - 440 K/uL   Neutrophils Relative % 83 %   Neutro Abs 7.9 (H) 1.4 - 6.5 K/uL   Lymphocytes Relative 10 %   Lymphs Abs 1.0 1.0 - 3.6 K/uL   Monocytes Relative 7 %   Monocytes Absolute 0.7 0.2 - 1.0 K/uL   Eosinophils Relative 0 %   Eosinophils Absolute 0.0 0 - 0.7 K/uL   Basophils Relative 0 %   Basophils Absolute 0.0 0 - 0.1 K/uL  Basic metabolic panel     Status: Abnormal   Collection Time: 06/19/18  9:11 AM  Result Value Ref Range   Sodium 138 135 - 145 mmol/L   Potassium 4.1 3.5 - 5.1 mmol/L   Chloride 106 98 - 111 mmol/L   CO2 25 22 - 32 mmol/L   Glucose, Bld 111 (H) 70 - 99 mg/dL   BUN 23 (H) 6 - 20 mg/dL   Creatinine, Ser 0.65 0.61 - 1.24 mg/dL   Calcium 8.7 (L) 8.9 - 10.3 mg/dL   GFR calc non Af Amer >60 >60 mL/min   GFR calc Af Amer >60 >60 mL/min   Anion gap 7 5 - 15  Troponin I     Status: None   Collection Time: 06/19/18  9:11 AM  Result Value Ref Range   Troponin I <0.03 <0.03 ng/mL   ____________________________________________  EKG My review and personal interpretation at Time: 9:12   Indication: arm pain  Rate: 75   Rhythm: sinus Axis: normal Other: normal intervals, no stemi, nonspecific st abn ____________________________________________  RADIOLOGY  I personally reviewed all radiographic images ordered to evaluate for the above acute complaints and reviewed radiology reports and findings.  These findings were personally discussed with the patient.  Please see medical record for radiology report.  ____________________________________________   PROCEDURES  Procedure(s) performed:  Procedures    Critical Care performed: no ____________________________________________   INITIAL IMPRESSION / ASSESSMENT AND PLAN / ED COURSE  Pertinent labs & imaging results that were available during my care of the patient were reviewed by me and considered in my medical decision making (see chart for details).   DDX: dehydration, dysrhythmia, paresthesia, hypoglycemia, acs  Lela Gell is a 57 y.o. who presents to the ED with sx as described above.  Patient is AFVSS in ED. Exam as above. Given current presentation have considered the above differential.  Neuro exam non focal. Suspect some component of dehydration.  The patient will be placed on continuous pulse oximetry and telemetry for monitoring.  Laboratory evaluation will be sent to evaluate for the above complaints.      Clinical Course as of Jun 19 1132  Wed Jun 19, 2018  1025 Patient reassessed.  Remains asymptomatic but is complaining of recurrent pain related to his shingles.  Do suspect his symptoms this morning were related to dehydration as he feels much improved after IV fluids.  Not clinically consistent with dysrhythmia or ACS.  His abdominal exam is otherwise soft and benign.  Blood work is reassuring.  Patient be given a refill on his gabapentin.  Will be given referral to pain management clinic as well as PCP.  Discussed nodule found on his chest x-ray need for follow-up as an outpatient.  Patient was able to tolerate PO and was able to ambulate  with a steady gait.  Have discussed with the patient and available family all diagnostics and treatments performed thus far and all questions were answered to the best of my ability. The patient demonstrates understanding and agreement with plan.    [PR]    Clinical Course User Index [PR] Merlyn Lot, MD     As part of my medical decision making, I reviewed the following data within the [facility] notes reviewed and incorporated, Labs reviewed, notes from prior ED visits.  ____________________________________________   FINAL CLINICAL IMPRESSION(S) / ED DIAGNOSES  Final diagnoses:  Light headedness  Dehydration      NEW MEDICATIONS STARTED DURING THIS VISIT:  Discharge Medication List as of 06/19/2018 10:30 AM       Note:  This document was prepared using Dragon voice recognition software and may include unintentional dictation errors.    Merlyn Lot, MD 06/19/18 1133

## 2018-06-19 NOTE — ED Notes (Signed)
This RN spoke with Dr. Quentin Cornwall regarding patient care, pt's symptoms resolved on arrival to ED.

## 2018-06-19 NOTE — ED Notes (Signed)
NAD noted at time of D/C. Pt denies questions or concerns. Pt ambulatory to the lobby at this time.  

## 2018-06-19 NOTE — ED Triage Notes (Signed)
Pt presents to ED via [facility] with c/o L arm numbness/tingling and lightheadedness. Pt denies tingling on arrival to ED, pt also denies light headedness. Pt received 300cc's NS en route to hospital. Per EMS VSS. EMS reports pt has hx of shingles, finished meds approx 1 week ago, and finished last oxycodone yesterday.

## 2018-08-12 ENCOUNTER — Encounter: Payer: Self-pay | Admitting: Emergency Medicine

## 2018-08-12 ENCOUNTER — Other Ambulatory Visit: Payer: Self-pay

## 2018-08-12 ENCOUNTER — Emergency Department
Admission: EM | Admit: 2018-08-12 | Discharge: 2018-08-12 | Disposition: A | Discharge status: Home / Self Care | Payer: Self-pay | Attending: Emergency Medicine | Admitting: Emergency Medicine

## 2018-08-12 DIAGNOSIS — M792 Neuralgia and neuritis, unspecified: Secondary | ICD-10-CM | POA: Insufficient documentation

## 2018-08-12 DIAGNOSIS — F17210 Nicotine dependence, cigarettes, uncomplicated: Secondary | ICD-10-CM | POA: Insufficient documentation

## 2018-08-12 DIAGNOSIS — Z79899 Other long term (current) drug therapy: Secondary | ICD-10-CM | POA: Insufficient documentation

## 2018-08-12 DIAGNOSIS — B0229 Other postherpetic nervous system involvement: Secondary | ICD-10-CM

## 2018-08-12 MED ORDER — OXYCODONE-ACETAMINOPHEN 5-325 MG PO TABS: 1.0000 | ORAL_TABLET | Freq: Four times a day (QID) | ORAL | 0 refills | Status: DC | PRN

## 2018-08-12 NOTE — ED Notes (Signed)
See triage note  Presents with pain to right last rib area   States pain started about 4 months ago  States he was dx'd with shingles in past  Has been taking Aleve w/o relief

## 2018-08-12 NOTE — ED Triage Notes (Signed)
C/O continued pain to right ribs, flank, and back since May.  Patient states he was diagnosed with Shingles in May, which has cleared, but pain persists.

## 2018-08-12 NOTE — Discharge Instructions (Addendum)
Be advised that the Neurontin has 3 refills available.  Contact the [facility] to schedule an appointment for continued care.

## 2018-08-12 NOTE — ED Provider Notes (Signed)
[facility] Emergency Department Provider Note   ____________________________________________   First MD Initiated Contact with Patient 08/12/18 1146     (approximate)  I have reviewed the triage vital signs and the nursing notes.   HISTORY  Chief Complaint pain    HPI Theodore Hill is a 57 y.o. male patient presents with continued pain to right rib and flank area for 4 months.  Complaint is status post shingles.  Patient state he was only given a month supply of Neurontin and 5 days of Percocets.  However review of the patient's last discharge showed that he was given Neurontin with 3 refills.  Patient was unaware that he had refills of Neurontin.  Patient also state he was unaware that he was supposed to follow-up with the [facility].  Reviewed last discharge instructions with patient.   Past Medical History:  Diagnosis Date  . Bladder cancer ([place])   . Shingles 04/27/2018    There are no active problems to display for this patient.   History reviewed. No pertinent surgical history.  Prior to Admission medications   Medication Sig Start Date End Date Taking? Authorizing Provider  gabapentin (NEURONTIN) 100 MG capsule Take 1 capsule (100 mg total) by mouth 3 (three) times daily. 06/19/18   Merlyn Lot, MD  oxyCODONE-acetaminophen (PERCOCET/ROXICET) 5-325 MG tablet Take 1 tablet by mouth every 6 (six) hours as needed for severe pain. 08/12/18   Sable Feil, PA-C  predniSONE (DELTASONE) 10 MG tablet Take 6 pills on day 1 and decrease by 1 pill each day until all are gone.  Take these early in the morning. 06/03/18   Versie Starks, PA-C    Allergies Patient has no known allergies.  No family history on file.  Social History Social History   Tobacco Use  . Smoking status: Current Every Day Smoker    Packs/day: 1.00    Types: Cigarettes  . Smokeless tobacco: Never Used  Substance Use Topics  . Alcohol use: Yes    Comment: daily    . Drug use: Not on file    Review of Systems Constitutional: No fever/chills Eyes: No visual changes. ENT: No sore throat. Cardiovascular: Denies chest pain. Respiratory: Denies shortness of breath. Gastrointestinal: No abdominal pain.  No nausea, no vomiting.  No diarrhea.  No constipation. Genitourinary: Negative for dysuria. Musculoskeletal: Negative for back pain. Skin: Negative for rash. Neurological: Negative for headaches, focal weakness or numbness.  Herpetic pain.   ____________________________________________   PHYSICAL EXAM:  VITAL SIGNS: ED Triage Vitals  Enc Vitals Group     BP 08/12/18 1146 129/88     Pulse Rate 08/12/18 1146 82     Resp 08/12/18 1146 16     Temp 08/12/18 1146 98 F (36.7 C)     Temp Source 08/12/18 1146 Oral     SpO2 08/12/18 1146 99 %     Weight 08/12/18 1143 150 lb (68 kg)     Height 08/12/18 1143 5\' 10"  (1.778 m)     Head Circumference --      Peak Flow --      Pain Score 08/12/18 1142 9     Pain Loc --      Pain Edu? --      Excl. in [place]? --     Constitutional: Alert and oriented. Well appearing and in no acute distress. Hematological/Lymphatic/Immunilogical: No cervical lymphadenopathy. Cardiovascular: Normal rate, regular rhythm. Grossly normal heart sounds.  Good peripheral circulation. Respiratory: Normal  respiratory effort.  No retractions. Lungs CTAB. Musculoskeletal: No lower extremity tenderness nor edema.  No joint effusions. Neurologic:  Normal speech and language. No gross focal neurologic deficits are appreciated. No gait instability. Skin:  Skin is warm, dry and intact. No rash noted. Psychiatric: Mood and affect are normal. Speech and behavior are normal.  ____________________________________________   LABS (all labs ordered are listed, but only abnormal results are displayed)  Labs Reviewed - No data to  display ____________________________________________  EKG   ____________________________________________  RADIOLOGY  ED MD interpretation:    Official radiology report(s): No results found.  ____________________________________________   PROCEDURES  Procedure(s) performed: None  Procedures  Critical Care performed: No  ____________________________________________   INITIAL IMPRESSION / ASSESSMENT AND PLAN / ED COURSE  As part of my medical decision making, I reviewed the following data within the [place]    Patient presents with post herpetic neuralgia secondary to shingles.  Patient given discharge care instructions and advised to continue taking Neurontin and follow-up with the [facility] for continued care.      ____________________________________________   FINAL CLINICAL IMPRESSION(S) / ED DIAGNOSES  Final diagnoses:  Neuralgia, postherpetic     ED Discharge Orders         Ordered    oxyCODONE-acetaminophen (PERCOCET/ROXICET) 5-325 MG tablet  Every 6 hours PRN,   Status:  Discontinued     08/12/18 1202    oxyCODONE-acetaminophen (PERCOCET/ROXICET) 5-325 MG tablet  Every 6 hours PRN     08/12/18 1212           Note:  This document was prepared using Dragon voice recognition software and may include unintentional dictation errors.    Sable Feil, PA-C 08/12/18 1219    Harvest Dark, MD 08/12/18 1409

## 2019-09-14 IMAGING — CR DG CHEST 2V
2 series · 2 of 2 positions shown · non-contrast
Comparison: None.

CLINICAL DATA: Decreased breath sounds, diagnosed with shingles [REDACTED]. Right upper abdominal pain and back pain.

EXAM:
CHEST - 2 VIEW

[chest pa]
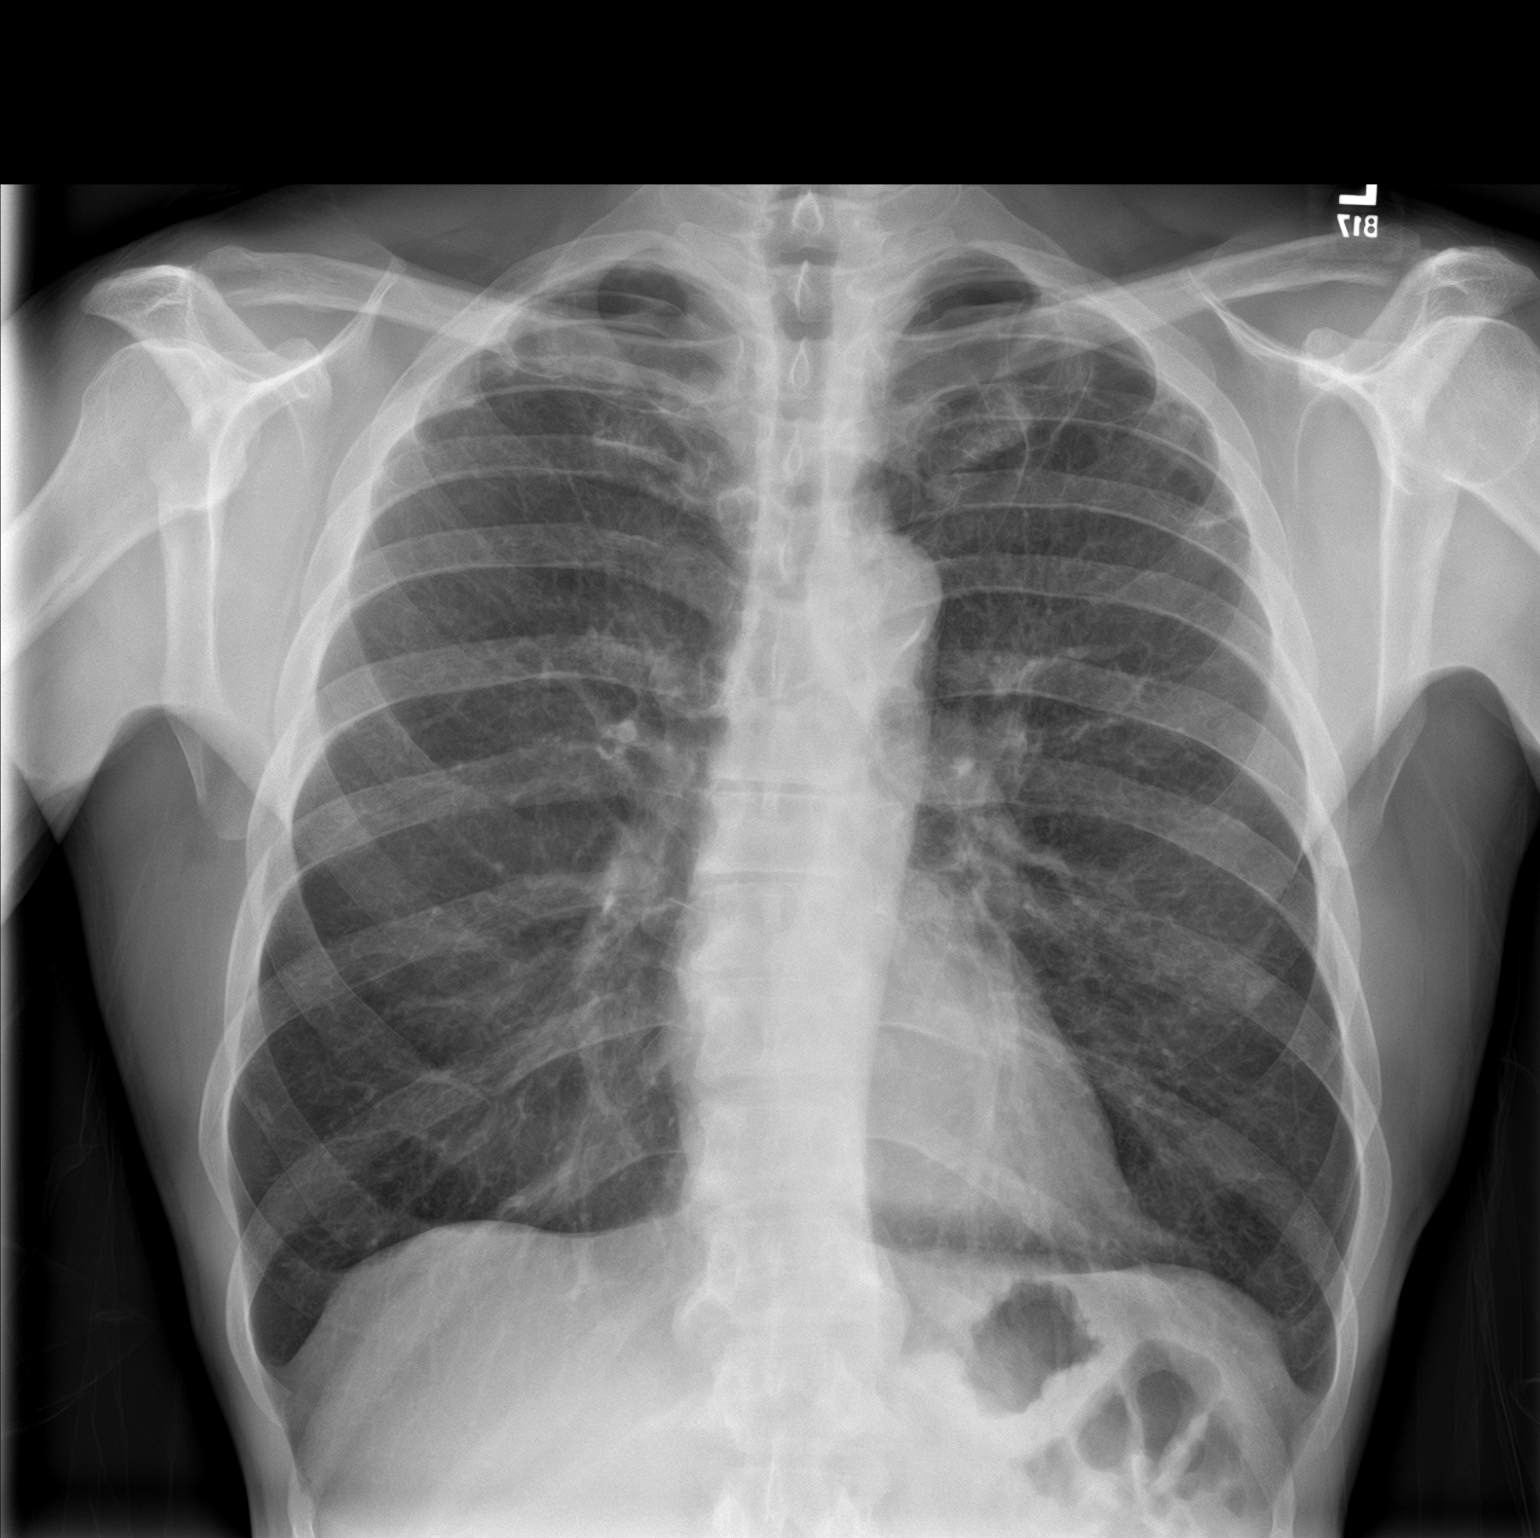

[chest lat]
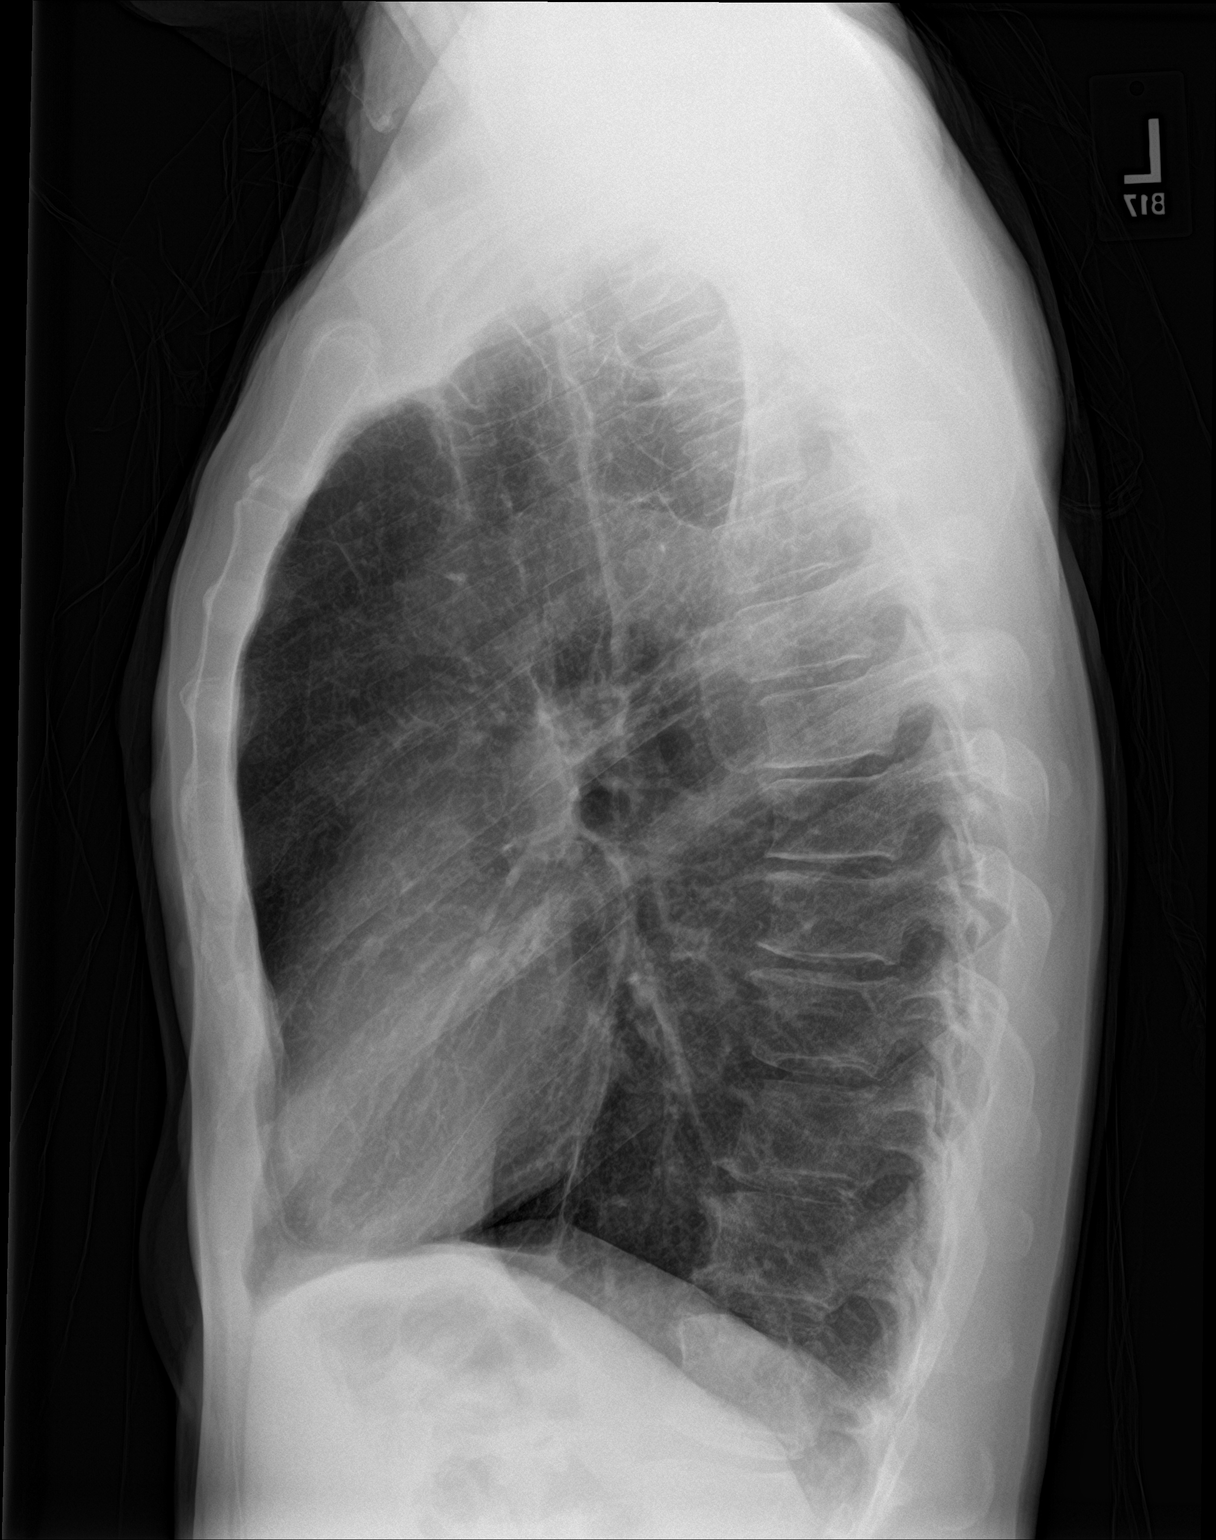

[2 of 2 positions shown; findings below may reference images not displayed]

FINDINGS: The lungs are hyperinflated likely secondary to COPD. There is
biapical bullous disease. There is no focal parenchymal opacity.
There is no pleural effusion or pneumothorax. The heart and
mediastinal contours are unremarkable.

The osseous structures are unremarkable.
IMPRESSION: No active cardiopulmonary disease.

COPD.

## 2020-03-25 ENCOUNTER — Ambulatory Visit: Payer: Self-pay | Attending: Internal Medicine

## 2020-03-25 DIAGNOSIS — Z23 Encounter for immunization: Secondary | ICD-10-CM

## 2020-03-25 NOTE — Progress Notes (Signed)
   Covid-19 Vaccination Clinic  Name:  Theodore Hill    MRN: [id number] DOB: [birth year]  03/25/2020  Theodore Hill was observed post Covid-19 immunization for 15 minutes without incident. He was provided with Vaccine Information Sheet and instruction to access the V-Safe system.   Theodore Hill was instructed to call 911 with any severe reactions post vaccine: Marland Kitchen Difficulty breathing  . Swelling of face and throat  . A fast heartbeat  . A bad rash all over body  . Dizziness and weakness   Immunizations Administered    Name Date Dose VIS Date Route   Pfizer COVID-19 Vaccine 03/25/2020  4:08 PM 0.3 mL 12/05/2019 Intramuscular   Manufacturer: [place]   Lot: [id number]   [place]: [id number]

## 2020-04-16 ENCOUNTER — Ambulatory Visit: Payer: Self-pay | Attending: Internal Medicine

## 2020-04-16 DIAGNOSIS — Z23 Encounter for immunization: Secondary | ICD-10-CM

## 2020-04-16 NOTE — Progress Notes (Signed)
   Covid-19 Vaccination Clinic  Name:  Theodore Hill    MRN: [id number] DOB: 09-Aug-1961  04/16/2020  [name] was observed post Covid-19 immunization for 15 minutes without incident. He was provided with Vaccine Information Sheet and instruction to access the V-Safe system.   [name] was instructed to call 911 with any severe reactions post vaccine: Marland Kitchen Difficulty breathing  . Swelling of face and throat  . A fast heartbeat  . A bad rash all over body  . Dizziness and weakness   Immunizations Administered    Name Date Dose VIS Date Route   Pfizer COVID-19 Vaccine 04/16/2020  3:57 PM 0.3 mL 02/18/2019 Intramuscular   Manufacturer: [employer]   Lot: [id number]   [place]: [id number]

## 2020-05-29 ENCOUNTER — Encounter: Payer: Self-pay | Admitting: Emergency Medicine

## 2020-05-29 ENCOUNTER — Emergency Department: Payer: Commercial Managed Care - PPO

## 2020-05-29 ENCOUNTER — Other Ambulatory Visit: Payer: Self-pay

## 2020-05-29 ENCOUNTER — Emergency Department
Admission: EM | Admit: 2020-05-29 | Discharge: 2020-05-29 | Disposition: A | Discharge status: Home / Self Care | Payer: Commercial Managed Care - PPO | Attending: Student | Admitting: Student

## 2020-05-29 DIAGNOSIS — M25559 Pain in unspecified hip: Secondary | ICD-10-CM

## 2020-05-29 DIAGNOSIS — M25551 Pain in right hip: Secondary | ICD-10-CM | POA: Insufficient documentation

## 2020-05-29 DIAGNOSIS — Z8551 Personal history of malignant neoplasm of bladder: Secondary | ICD-10-CM | POA: Diagnosis not present

## 2020-05-29 DIAGNOSIS — F17210 Nicotine dependence, cigarettes, uncomplicated: Secondary | ICD-10-CM | POA: Insufficient documentation

## 2020-05-29 MED ORDER — LIDOCAINE 5 % EX PTCH
1.0000 | MEDICATED_PATCH | CUTANEOUS | Status: DC
  Administered 2020-05-29: 1 via TRANSDERMAL
  Filled 2020-05-29: qty 1

## 2020-05-29 MED ORDER — TRAMADOL HCL 50 MG PO TABS: 50.0000 mg | ORAL_TABLET | Freq: Four times a day (QID) | ORAL | 0 refills | Status: AC | PRN

## 2020-05-29 MED ORDER — IBUPROFEN 600 MG PO TABS: 600.0000 mg | ORAL_TABLET | Freq: Four times a day (QID) | ORAL | 0 refills | Status: AC | PRN

## 2020-05-29 MED ORDER — CYCLOBENZAPRINE HCL 5 MG PO TABS: ORAL_TABLET | ORAL | 0 refills | Status: AC

## 2020-05-29 MED ORDER — OXYCODONE-ACETAMINOPHEN 5-325 MG PO TABS
1.0000 | ORAL_TABLET | Freq: Once | ORAL | Status: AC
  Administered 2020-05-29: 1 via ORAL
  Filled 2020-05-29: qty 1

## 2020-05-29 MED ORDER — LIDOCAINE 5 % EX PTCH: 1.0000 | MEDICATED_PATCH | CUTANEOUS | 0 refills | Status: AC

## 2020-05-29 NOTE — ED Notes (Signed)
See triage note  Presents with pain to right hip area since yesterday   Denies any recent injury  Unable to bear wt  States pain starts at hip area and moves into upper leg

## 2020-05-29 NOTE — ED Provider Notes (Signed)
[facility] Emergency Department Provider Note  ____________________________________________  Time seen: Approximately 9:47 AM  I have reviewed the triage vital signs and the nursing notes.   HISTORY  Chief Complaint Hip Pain    HPI Theodore Hill is a 59 y.o. male that presents that presents to emergency department for evaluation of right hip pain since yesterday.  Patient states that he was lifting a bunch of heavy propane tanks when he felt a "twinge" to his right hip.  He then ignored the pain and proceeded to mow his very large yard.  He has had pain with bearing weight to right leg since incident.  Pain has stayed localized to his right hip.  He had difficulty sleeping last night due to the pain.  No bowel or bladder dysfunction or saddle anesthesias.  No specific trauma.  No falls.  Patient has a history of bladder cancer and was told that it had cleared so he has not followed up in several years, although he is supposed to follow-up yearly.  No back pain, abdominal pain.   Past Medical History:  Diagnosis Date  . Bladder cancer ([place])   . Shingles 04/27/2018    There are no problems to display for this patient.   History reviewed. No pertinent surgical history.  Prior to Admission medications   Medication Sig Start Date End Date Taking? Authorizing Provider  cyclobenzaprine (FLEXERIL) 5 MG tablet Take 1-2 tablets 3 times daily as needed 05/29/20   Laban Emperor, PA-C  gabapentin (NEURONTIN) 100 MG capsule Take 1 capsule (100 mg total) by mouth 3 (three) times daily. 06/19/18   Merlyn Lot, MD  ibuprofen (ADVIL) 600 MG tablet Take 1 tablet (600 mg total) by mouth every 6 (six) hours as needed. 05/29/20   Laban Emperor, PA-C  lidocaine (LIDODERM) 5 % Place 1 patch onto the skin daily. Remove & Discard patch within 12 hours or as directed by MD 05/29/20   Laban Emperor, PA-C  traMADol (ULTRAM) 50 MG tablet Take 1 tablet (50 mg total) by mouth every 6  (six) hours as needed. 05/29/20 05/29/21  Laban Emperor, PA-C    Allergies Patient has no known allergies.  No family history on file.  Social History Social History   Tobacco Use  . Smoking status: Current Every Day Smoker    Packs/day: 1.00    Types: Cigarettes  . Smokeless tobacco: Never Used  Substance Use Topics  . Alcohol use: Yes    Comment: daily  . Drug use: Not Currently     Review of Systems  Respiratory: No SOB. Gastrointestinal: No abdominal pain.  No nausea, no vomiting.  Genitourinary: No urinary symptoms. Musculoskeletal: Positive for hip pain. Skin: Negative for rash, abrasions, lacerations, ecchymosis. Neurological: Negative for headaches, numbness or tingling   ____________________________________________   PHYSICAL EXAM:  VITAL SIGNS: ED Triage Vitals  Enc Vitals Group     BP 05/29/20 0748 (!) 135/92     Pulse Rate 05/29/20 0748 98     Resp 05/29/20 0748 18     Temp 05/29/20 0748 98.9 F (37.2 C)     Temp Source 05/29/20 0748 Oral     SpO2 05/29/20 0748 100 %     Weight 05/29/20 0749 150 lb (68 kg)     Height 05/29/20 0749 5\' 10"  (1.778 m)     Head Circumference --      Peak Flow --      Pain Score 05/29/20 0810 10  Pain Loc --      Pain Edu? --      Excl. in [place]? --      Constitutional: Alert and oriented. Well appearing and in no acute distress. Eyes: Conjunctivae are normal. PERRL. EOMI. Head: Atraumatic. ENT:      Ears:      Nose: No congestion/rhinnorhea.      Mouth/Throat: Mucous membranes are moist.  Neck: No stridor.  Cardiovascular: Normal rate, regular rhythm.  Good peripheral circulation. Respiratory: Normal respiratory effort without tachypnea or retractions. Lungs CTAB. Good air entry to the bases with no decreased or absent breath sounds. Gastrointestinal: Bowel sounds 4 quadrants. Soft and nontender to palpation. No guarding or rigidity. No palpable masses. No distention.  Musculoskeletal: Full range of motion to  all extremities. No gross deformities appreciated.  Tenderness to palpation to right trochanteric bursa.  Full range of motion of right hip.  Ambulatory without assistance. Neurologic:  Normal speech and language. No gross focal neurologic deficits are appreciated.  Skin:  Skin is warm, dry and intact. No rash noted. Psychiatric: Mood and affect are normal. Speech and behavior are normal. Patient exhibits appropriate insight and judgement.   ____________________________________________   LABS (all labs ordered are listed, but only abnormal results are displayed)  Labs Reviewed - No data to display ____________________________________________  EKG   ____________________________________________  RADIOLOGY Robinette Haines, personally viewed and evaluated these images (plain radiographs) as part of my medical decision making, as well as reviewing the written report by the radiologist.  DG Hip Unilat W or Wo Pelvis 2-3 Views Right  Result Date: 05/29/2020 CLINICAL DATA:  c/o right hip pain that started last night. Pt states that he has not injured the hip that he is aware of. Pt was able to do normal activities yesterday then started having pain last night. Hx of bladder cancer EXAM: DG HIP (WITH OR WITHOUT PELVIS) 2-3V RIGHT COMPARISON:  None. FINDINGS: There is no evidence of hip fracture or dislocation. There is no evidence of arthropathy or other focal bone abnormality. IMPRESSION: Negative. Electronically Signed   By: Lajean Manes M.D.   On: 05/29/2020 10:43    ____________________________________________    PROCEDURES  Procedure(s) performed:    Procedures    Medications  lidocaine (LIDODERM) 5 % 1 patch (1 patch Transdermal Patch Applied 05/29/20 1009)  oxyCODONE-acetaminophen (PERCOCET/ROXICET) 5-325 MG per tablet 1 tablet (1 tablet Oral Given 05/29/20 1009)     ____________________________________________   INITIAL IMPRESSION / ASSESSMENT AND PLAN / ED  COURSE  Pertinent labs & imaging results that were available during my care of the patient were reviewed by me and considered in my medical decision making (see chart for details).  Review of the [place] CSRS was performed in accordance of the [place] prior to dispensing any controlled drugs.   Patient presented to emergency department for evaluation of hip pain.  Vital signs and exam are reassuring.  X-ray negative for acute bony abnormalities.  Patient was given a dose of Percocet and a Lidoderm patch for pain.  Patient states that pain is easing off.  Patient does have a history of bladder cancer, for which he has not been actively following urology for.  He declines any additional lab work or CT scans to further evaluate for possible malignancy.  His exam is consistent with musculoskeletal pain following yard work yesterday.  Patient was encouraged to follow-up with urology and he is agreeable and is requesting a referral.  He is from [place] and his old urologist is here.  Patient will be discharged home with prescriptions for Flexeril, Lidoderm, Motrin, short course of tramadol. Patient is to follow up with primary care and urology as directed. Patient is given ED precautions to return to the ED for any worsening or new symptoms.  Cas Tracz was evaluated in Emergency Department on 05/29/2020 for the symptoms described in the history of present illness. He was evaluated in the context of the global COVID-19 pandemic, which necessitated consideration that the patient might be at risk for infection with the SARS-CoV-2 virus that causes COVID-19. Institutional protocols and algorithms that pertain to the evaluation of patients at risk for COVID-19 are in a state of rapid change based on information released by regulatory bodies including the CDC and federal and state organizations. These policies and algorithms were followed during the patient's care in the  ED.   ____________________________________________  FINAL CLINICAL IMPRESSION(S) / ED DIAGNOSES  Final diagnoses:  Hip pain  Hx of bladder cancer      NEW MEDICATIONS STARTED DURING THIS VISIT:  ED Discharge Orders         Ordered    cyclobenzaprine (FLEXERIL) 5 MG tablet     05/29/20 1134    traMADol (ULTRAM) 50 MG tablet  Every 6 hours PRN     05/29/20 1135    lidocaine (LIDODERM) 5 %  Every 24 hours     05/29/20 1135    ibuprofen (ADVIL) 600 MG tablet  Every 6 hours PRN     05/29/20 1135              This chart was dictated using voice recognition software/Dragon. Despite best efforts to proofread, errors can occur which can change the meaning. Any change was purely unintentional.    Laban Emperor, PA-C 05/29/20 1313    Lilia Pro., MD 05/29/20 Dorthula Perfect

## 2020-05-29 NOTE — ED Triage Notes (Signed)
Pt to ED via POV c/o right hip pain that started last night. Pt states that he has not injured the hip that he is aware of. Pt was able to do normal activities yesterday then started having pain last night. Pt states that he was unable to sleep due to the pain. Pt states that when he woke up this morning the pain was worse.

## 2021-12-22 ENCOUNTER — Other Ambulatory Visit: Payer: Self-pay

## 2021-12-22 ENCOUNTER — Emergency Department: Payer: Commercial Managed Care - PPO

## 2021-12-22 ENCOUNTER — Emergency Department
Admission: EM | Admit: 2021-12-22 | Discharge: 2021-12-22 | Disposition: A | Discharge status: Home / Self Care | Payer: Commercial Managed Care - PPO | Attending: Emergency Medicine | Admitting: Emergency Medicine

## 2021-12-22 ENCOUNTER — Encounter: Payer: Self-pay | Admitting: Emergency Medicine

## 2021-12-22 DIAGNOSIS — R42 Dizziness and giddiness: Secondary | ICD-10-CM | POA: Insufficient documentation

## 2021-12-22 DIAGNOSIS — Z8551 Personal history of malignant neoplasm of bladder: Secondary | ICD-10-CM | POA: Insufficient documentation

## 2021-12-22 DIAGNOSIS — F17210 Nicotine dependence, cigarettes, uncomplicated: Secondary | ICD-10-CM | POA: Insufficient documentation

## 2021-12-22 LAB — CBC
HCT: 42.7 % (ref 39.0–52.0)
Hemoglobin: 14.9 g/dL (ref 13.0–17.0)
MCH: 34.4 pg — ABNORMAL HIGH (ref 26.0–34.0)
MCHC: 34.9 g/dL (ref 30.0–36.0)
MCV: 98.6 fL (ref 80.0–100.0)
Platelets: 194 10*3/uL (ref 150–400)
RBC: 4.33 MIL/uL (ref 4.22–5.81)
RDW: 12.1 % (ref 11.5–15.5)
WBC: 8.5 10*3/uL (ref 4.0–10.5)
nRBC: 0 % (ref 0.0–0.2)

## 2021-12-22 LAB — CBG MONITORING, ED: Glucose-Capillary: 153 mg/dL — ABNORMAL HIGH (ref 70–99)

## 2021-12-22 LAB — DIFFERENTIAL
Abs Immature Granulocytes: 0.03 10*3/uL (ref 0.00–0.07)
Basophils Absolute: 0.1 10*3/uL (ref 0.0–0.1)
Basophils Relative: 1 %
Eosinophils Absolute: 0.1 10*3/uL (ref 0.0–0.5)
Eosinophils Relative: 1 %
Immature Granulocytes: 0 %
Lymphocytes Relative: 21 %
Lymphs Abs: 1.8 10*3/uL (ref 0.7–4.0)
Monocytes Absolute: 0.8 10*3/uL (ref 0.1–1.0)
Monocytes Relative: 10 %
Neutro Abs: 5.7 10*3/uL (ref 1.7–7.7)
Neutrophils Relative %: 67 %

## 2021-12-22 LAB — COMPREHENSIVE METABOLIC PANEL
ALT: 15 U/L (ref 0–44)
AST: 20 U/L (ref 15–41)
Albumin: 4.3 g/dL (ref 3.5–5.0)
Alkaline Phosphatase: 53 U/L (ref 38–126)
Anion gap: 8 (ref 5–15)
BUN: 19 mg/dL (ref 8–23)
CO2: 24 mmol/L (ref 22–32)
Calcium: 8.9 mg/dL (ref 8.9–10.3)
Chloride: 103 mmol/L (ref 98–111)
Creatinine, Ser: 0.76 mg/dL (ref 0.61–1.24)
GFR, Estimated: >60 mL/min (ref >60)
Glucose, Bld: 140 mg/dL — ABNORMAL HIGH (ref 70–99)
Potassium: 3.7 mmol/L (ref 3.5–5.1)
Sodium: 135 mmol/L (ref 135–145)
Total Bilirubin: 0.7 mg/dL (ref 0.3–1.2)
Total Protein: 7.2 g/dL (ref 6.5–8.1)

## 2021-12-22 LAB — APTT: aPTT: 26 seconds (ref 24–36)

## 2021-12-22 LAB — PROTIME-INR
INR: 1 (ref 0.8–1.2)
Prothrombin Time: 13.2 seconds (ref 11.4–15.2)

## 2021-12-22 NOTE — ED Triage Notes (Signed)
Pt comes into the ED via POV c/o dizziness that started today.  NIH negative.  Pt had the dizziness while working.  Pt states the dizziness was suddenly at 11:45 and then he felt as though he was going to lose his balance.  Pt denies falling and denies any unilateral weakness.  Per his wife, he called her and his speech sounded different than normal.    84 HR 96% 151/91 178 CBG 12 lead shows right bundle branch

## 2021-12-22 NOTE — ED Provider Notes (Signed)
[facility] Emergency Department Provider Note   ____________________________________________   Event Date/Time   First MD Initiated Contact with Patient 12/22/21 1654     (approximate)  I have reviewed the triage vital signs and the nursing notes.   HISTORY  Chief Complaint Dizziness    HPI Theodore Hill is a 60 y.o. male who presents for lightheadedness that occurred just prior to arrival and was associated with difficulty walking.  Patient states that he felt like he was listing to 1 side but denies any vertigo symptoms of the room spinning around him.  States that the symptoms lasted approximately 30 minutes and had fully resolved by the time he got into the EMS rig on the way to the emergency department.  Patient denies any recurrence of symptoms or any symptoms similar to this previously.  Patient denies any history of vertigo.          Past Medical History:  Diagnosis Date   Bladder cancer ([place])    Shingles 04/27/2018    There are no problems to display for this patient.   History reviewed. No pertinent surgical history.  Prior to Admission medications   Medication Sig Start Date End Date Taking? Authorizing Provider  cyclobenzaprine (FLEXERIL) 5 MG tablet Take 1-2 tablets 3 times daily as needed 05/29/20   Laban Emperor, PA-C  gabapentin (NEURONTIN) 100 MG capsule Take 1 capsule (100 mg total) by mouth 3 (three) times daily. 06/19/18   Merlyn Lot, MD  ibuprofen (ADVIL) 600 MG tablet Take 1 tablet (600 mg total) by mouth every 6 (six) hours as needed. 05/29/20   Laban Emperor, PA-C  lidocaine (LIDODERM) 5 % Place 1 patch onto the skin daily. Remove & Discard patch within 12 hours or as directed by MD 05/29/20   Laban Emperor, PA-C    Allergies Patient has no known allergies.  History reviewed. No pertinent family history.  Social History Social History   Tobacco Use   Smoking status: Every Day    Packs/day: 1.00    Types:  Cigarettes   Smokeless tobacco: Never  Substance Use Topics   Alcohol use: Yes    Comment: daily   Drug use: Not Currently    Review of Systems Constitutional: No fever/chills Eyes: No visual changes. ENT: No sore throat. Cardiovascular: Denies chest pain. Respiratory: Denies shortness of breath. Gastrointestinal: No abdominal pain.  No nausea, no vomiting.  No diarrhea. Genitourinary: Negative for dysuria. Musculoskeletal: Negative for acute arthralgias Skin: Negative for rash. Neurological: Endorses lightheadedness. Negative for headaches, weakness/numbness/paresthesias in any extremity Psychiatric: Negative for suicidal ideation/homicidal ideation ____________________________________________ PHYSICAL EXAM:  VITAL SIGNS: ED Triage Vitals  Enc Vitals Group     BP 12/22/21 1413 (!) 131/91     Pulse Rate 12/22/21 1413 73     Resp 12/22/21 1413 15     Temp 12/22/21 1413 98.1 F (36.7 C)     Temp Source 12/22/21 1413 Oral     SpO2 12/22/21 1413 94 %     Weight 12/22/21 1306 149 lb 14.6 oz (68 kg)     Height 12/22/21 1306 5\' 10"  (1.778 m)     Head Circumference --      Peak Flow --      Pain Score 12/22/21 1306 0     Pain Loc --      Pain Edu? --      Excl. in [place]? --    Constitutional: Alert and oriented. Well appearing and in no acute  distress. Eyes: Conjunctivae are normal. PERRL. Head: Atraumatic. Nose: No congestion/rhinnorhea. Mouth/Throat: Mucous membranes are moist. Neck: No stridor Cardiovascular: Grossly normal heart sounds.  Good peripheral circulation. Respiratory: Normal respiratory effort.  No retractions. Gastrointestinal: Soft and nontender. No distention. Musculoskeletal: No obvious deformities Neurologic:  Normal speech and language. No gross focal neurologic deficits are appreciated.  Ambulatory without difficulty Skin:  Skin is warm and dry. No rash noted. Psychiatric: Mood and affect are normal. Speech and behavior are  normal.  ____________________________________________   LABS (all labs ordered are listed, but only abnormal results are displayed)  Labs Reviewed  CBC - Abnormal; Notable for the following components:      Result Value   MCH 34.4 (*)    All other components within normal limits  COMPREHENSIVE METABOLIC PANEL - Abnormal; Notable for the following components:   Glucose, Bld 140 (*)    All other components within normal limits  CBG MONITORING, ED - Abnormal; Notable for the following components:   Glucose-Capillary 153 (*)    All other components within normal limits  PROTIME-INR  APTT  DIFFERENTIAL  CBG MONITORING, ED   ____________________________________________  EKG  ED ECG REPORT I, Naaman Plummer, the attending physician, personally viewed and interpreted this ECG.  Date: 12/22/2021 EKG Time: 1315 Rate: 79 Rhythm: normal sinus rhythm QRS Axis: normal Intervals: Incomplete RBBB.  Premature atrial contractions ST/T Wave abnormalities: normal Narrative Interpretation: Premature atrial contractions with incomplete right bundle branch block.  No evidence of acute ischemia  ____________________________________________  RADIOLOGY  ED MD interpretation: CT of the head without contrast shows no evidence of acute abnormalities including no intracerebral hemorrhage, obvious masses, or significant edema  Official radiology report(s): CT HEAD WO CONTRAST  Result Date: 12/22/2021 CLINICAL DATA:  Neuro deficit, acute, stroke suspected; dizziness EXAM: CT HEAD WITHOUT CONTRAST TECHNIQUE: Contiguous axial images were obtained from the base of the skull through the vertex without intravenous contrast. COMPARISON:  None. FINDINGS: Brain: There is no acute intracranial hemorrhage, mass effect, or edema. Gray-white differentiation is preserved. There is no extra-axial fluid collection. Ventricles and sulci are within normal limits in size and configuration. Minimal patchy low-density  in the supratentorial white matter is nonspecific but may reflect minor chronic microvascular ischemic changes. Vascular: No hyperdense vessel or unexpected calcification. Skull: Calvarium is unremarkable. Sinuses/Orbits: No acute finding. Other: None. IMPRESSION: No acute intracranial abnormality. Electronically Signed   By: Macy Mis M.D.   On: 12/22/2021 16:22    ____________________________________________   PROCEDURES  Procedure(s) performed (including Critical Care):  Procedures   ____________________________________________   INITIAL IMPRESSION / ASSESSMENT AND PLAN / ED COURSE  As part of my medical decision making, I reviewed the following data within the electronic medical record, if available:  Nursing notes reviewed and incorporated, Labs reviewed, EKG interpreted, Old chart reviewed, Radiograph reviewed and Notes from prior ED visits reviewed and incorporated     Patient presents with complaints of presyncope ED Workup:  CBC, CMP, Troponin, ECG, head CT Differential diagnosis includes HF, ICH, seizure, stroke, HOCM, ACS, aortic dissection, malignant arrhythmia, or GI bleed. Findings: No evidence of acute laboratory abnormalities.  Troponin negative x1 EKG: No e/o STEMI. No evidence of Brugadas sign, delta wave, epsilon wave, significantly prolonged QTc, or malignant arrhythmia.  Disposition: Discharge. Patient is at baseline at this time. Return precautions expressed and understood in person. Advised follow up with primary care provider or clinic physician in next 24 hours.      ____________________________________________  FINAL CLINICAL IMPRESSION(S) / ED DIAGNOSES  Final diagnoses:  Lightheadedness     ED Discharge Orders     None        Note:  This document was prepared using Dragon voice recognition software and may include unintentional dictation errors.    Naaman Plummer, MD 12/22/21 [phone]

## 2021-12-22 NOTE — ED Notes (Signed)
Starleen Blue, MD evaluated patient in triage and due to no current symptoms, no "code stroke" to be called.

## 2023-04-04 IMAGING — CT CT HEAD W/O CM
4 series · 17 of 47 positions shown, 19 images · non-contrast
Comparison: None.

CLINICAL DATA: Neuro deficit, acute, stroke suspected; dizziness

EXAM:
CT HEAD WITHOUT CONTRAST
TECHNIQUE: Contiguous axial images were obtained from the base of the skull
through the vertex without intravenous contrast.

[Series 2: head wo · axial · 0.43mm/px · z∈[-94,+26]mm · 7 of 33 slices shown, 9 images]
[im 5/33  brain]
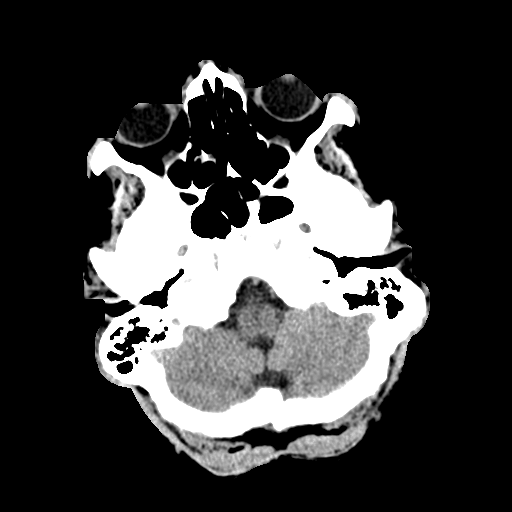
[im 5/33  bone]
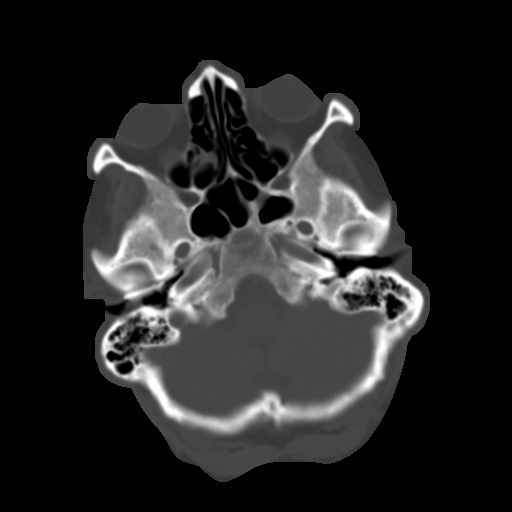
[im 9/33  brain]
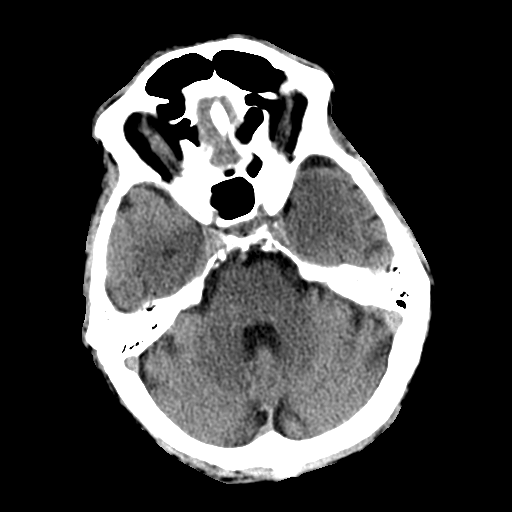
[im 13/33  brain]
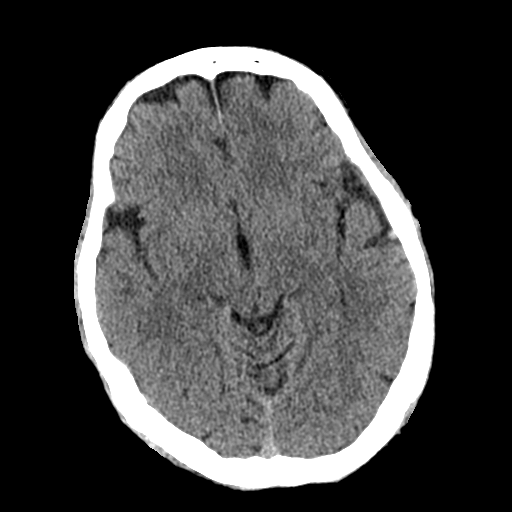
[im 17/33  brain]
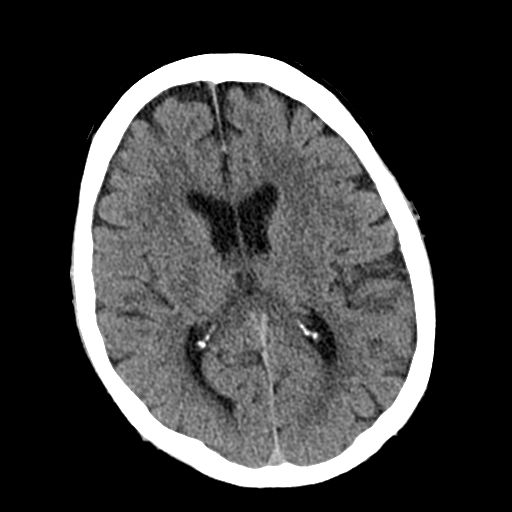
[im 21/33  brain]
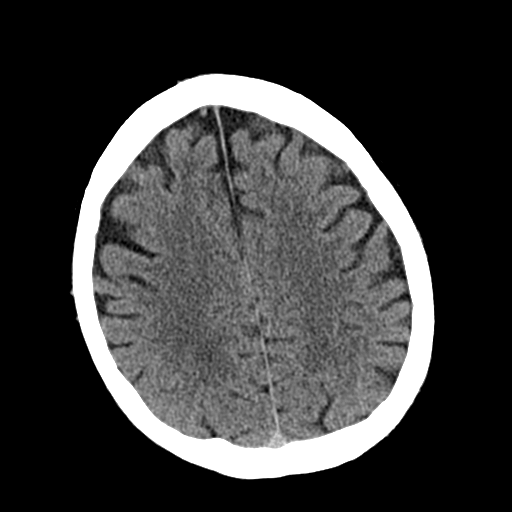
[im 21/33  bone]
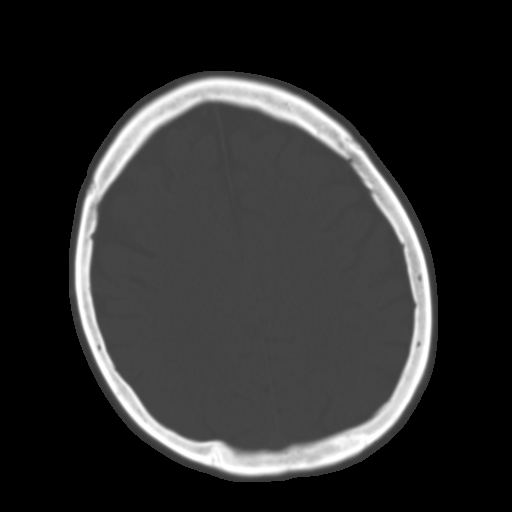
[im 25/33  brain]
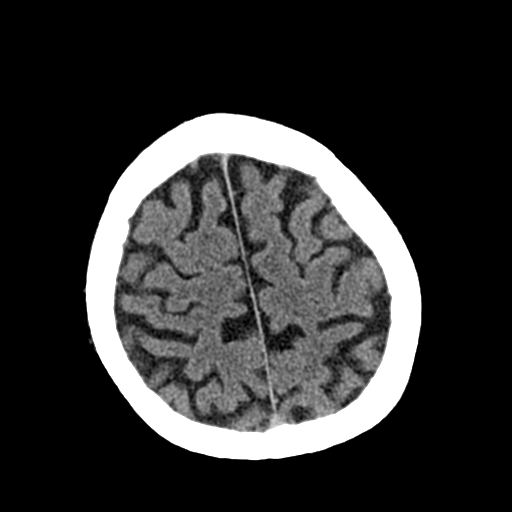
[im 29/33  brain]
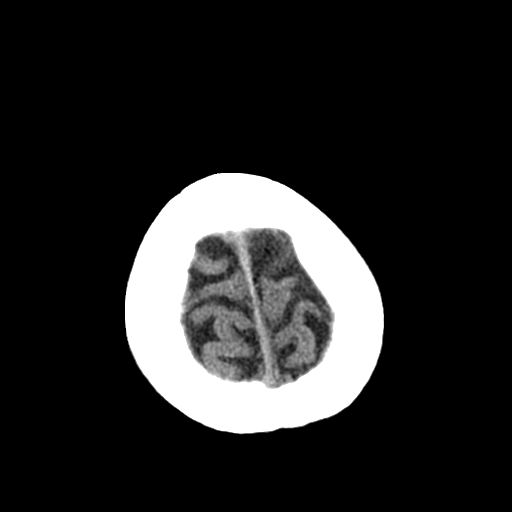

[Series 3: head bone · axial · 0.43mm/px · z∈[-98,-42]mm · 4 of 81 slices shown]
[im 9/81  bone]
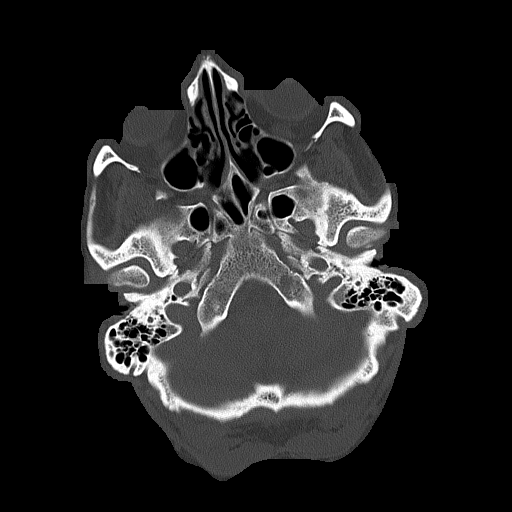
[im 17/81  bone]
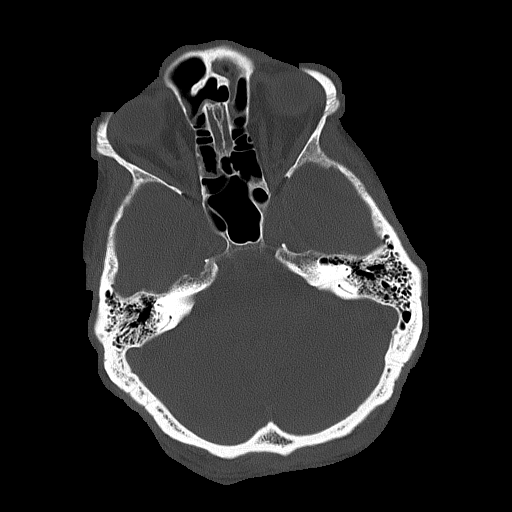
[im 25/81  bone]
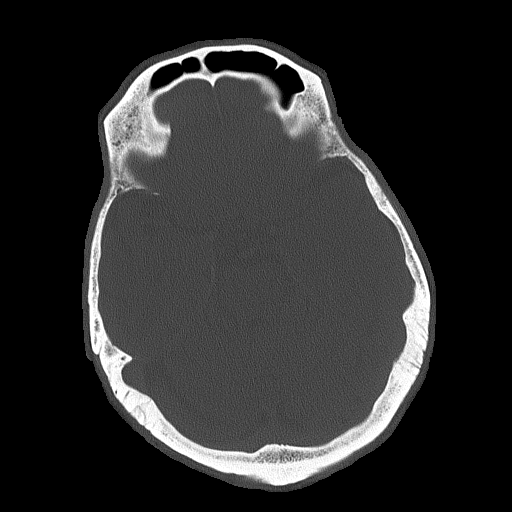
[im 37/81  bone]
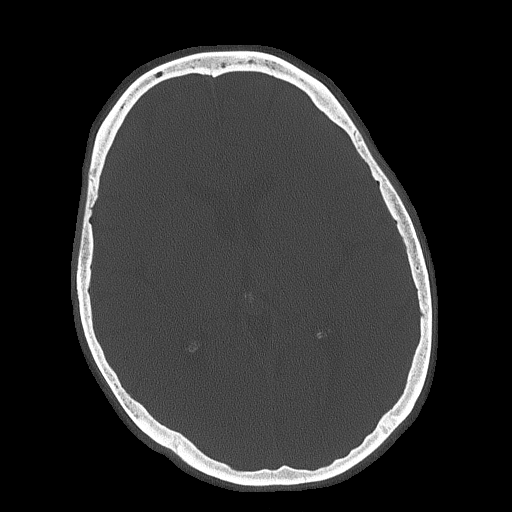

[Series 4: coronal soft tissue · coronal · 0.32mm/px · 3 of 69 slices shown]
[im 23/69  brain]
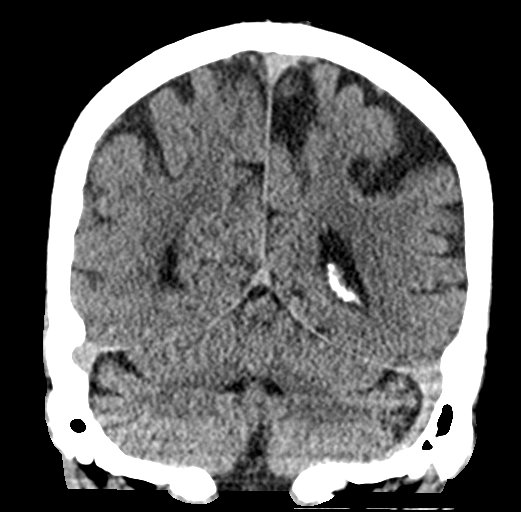
[im 31/69  brain]
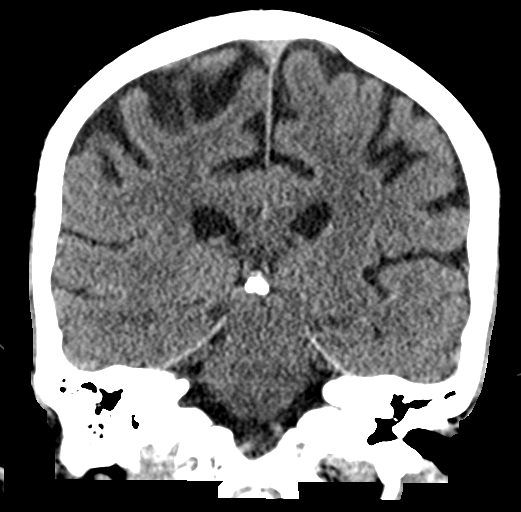
[im 38/69  brain]
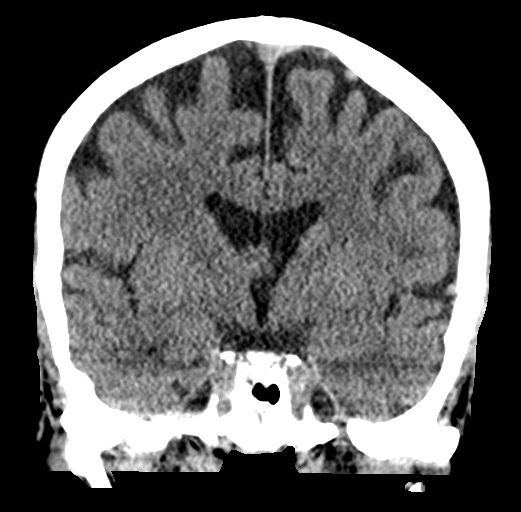

[Series 5: sagittal soft tissue · sagittal · 0.32mm/px · 3 of 56 slices shown]
[im 19/56  brain]
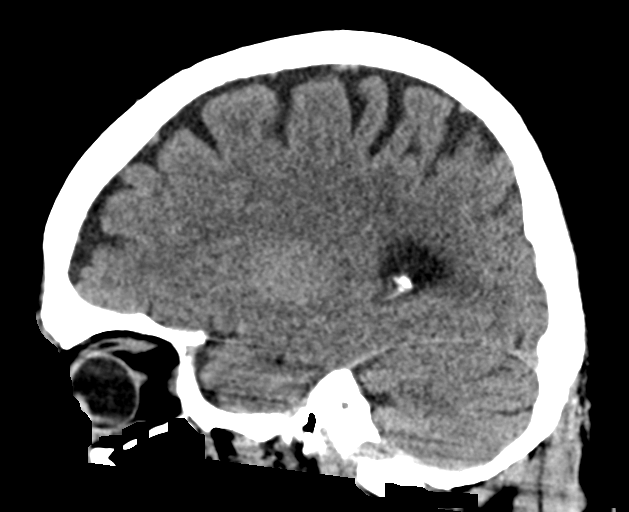
[im 28/56  brain]
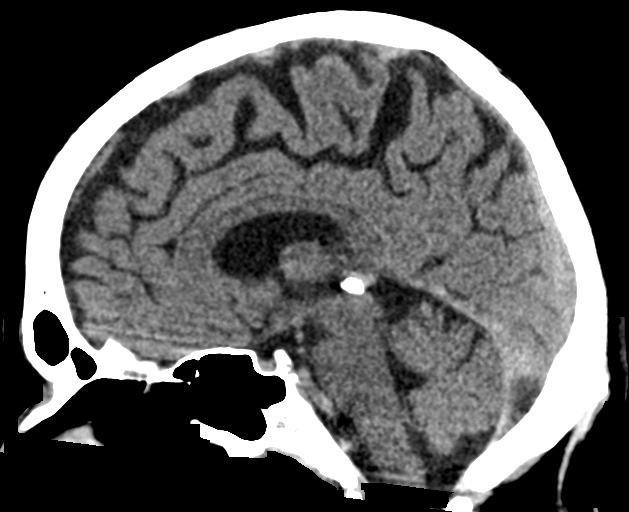
[im 37/56  brain]
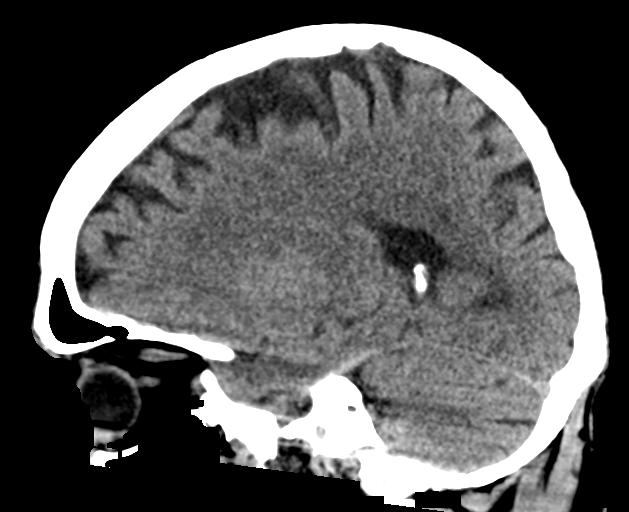

[17 of 47 positions shown; findings below may reference images not displayed]

FINDINGS: Brain: There is no acute intracranial hemorrhage, mass effect, or
edema. Gray-white differentiation is preserved. There is no
extra-axial fluid collection. Ventricles and sulci are within normal
limits in size and configuration. Minimal patchy low-density in the
supratentorial white matter is nonspecific but may reflect minor
chronic microvascular ischemic changes.

Vascular: No hyperdense vessel or unexpected calcification.

Skull: Calvarium is unremarkable.

Sinuses/Orbits: No acute finding.

Other: None.
IMPRESSION: No acute intracranial abnormality.
